# Patient Record
Sex: Female | Born: 1975 | Race: White | Hispanic: No | Marital: Single | Smoking: Never smoker
Health system: Southern US, Community
[De-identification: ages and names within clinical notes are randomized; demographics above are authoritative.]

## PROBLEM LIST (undated history)

## (undated) DIAGNOSIS — G43909 Migraine, unspecified, not intractable, without status migrainosus: Secondary | ICD-10-CM

## (undated) DIAGNOSIS — F419 Anxiety disorder, unspecified: Secondary | ICD-10-CM

## (undated) DIAGNOSIS — D649 Anemia, unspecified: Secondary | ICD-10-CM

## (undated) DIAGNOSIS — R569 Unspecified convulsions: Secondary | ICD-10-CM

## (undated) HISTORY — PX: TONSILLECTOMY: SUR1361

## (undated) HISTORY — PX: BACK SURGERY: SHX140

## (undated) HISTORY — PX: WISDOM TOOTH EXTRACTION: SHX21

---

## 1998-03-30 ENCOUNTER — Inpatient Hospital Stay (HOSPITAL_COMMUNITY): Admission: AD | Admit: 1998-03-30 | Discharge: 1998-04-01 | Payer: Self-pay | Admitting: Family Medicine

## 1999-04-15 ENCOUNTER — Ambulatory Visit (HOSPITAL_BASED_OUTPATIENT_CLINIC_OR_DEPARTMENT_OTHER): Admission: RE | Admit: 1999-04-15 | Discharge: 1999-04-15 | Payer: Self-pay | Admitting: *Deleted

## 2000-10-26 ENCOUNTER — Other Ambulatory Visit: Admission: RE | Admit: 2000-10-26 | Discharge: 2000-10-26 | Payer: Self-pay | Admitting: Obstetrics and Gynecology

## 2001-06-03 ENCOUNTER — Encounter: Payer: Self-pay | Admitting: Emergency Medicine

## 2001-06-03 ENCOUNTER — Emergency Department (HOSPITAL_COMMUNITY): Admission: EM | Admit: 2001-06-03 | Discharge: 2001-06-03 | Payer: Self-pay | Admitting: Emergency Medicine

## 2007-11-10 ENCOUNTER — Ambulatory Visit (HOSPITAL_COMMUNITY): Admission: RE | Admit: 2007-11-10 | Discharge: 2007-11-10 | Payer: Self-pay | Admitting: Orthopaedic Surgery

## 2011-03-12 LAB — CBC
HCT: 35.5 — ABNORMAL LOW
Hemoglobin: 11.9 — ABNORMAL LOW
MCHC: 33.5
MCV: 74.1 — ABNORMAL LOW
Platelets: 355
RBC: 4.79
RDW: 18.6 — ABNORMAL HIGH
WBC: 11.6 — ABNORMAL HIGH

## 2011-03-12 LAB — BASIC METABOLIC PANEL
BUN: 8
CO2: 29
Calcium: 9.1
Chloride: 106
Creatinine, Ser: 0.78
GFR calc Af Amer: >60
GFR calc non Af Amer: >60
Glucose, Bld: 101 — ABNORMAL HIGH
Potassium: 4.2
Sodium: 138

## 2013-12-06 ENCOUNTER — Other Ambulatory Visit (HOSPITAL_COMMUNITY)
Admission: RE | Admit: 2013-12-06 | Discharge: 2013-12-06 | Disposition: A | Discharge status: Home / Self Care | Payer: 59 | Source: Ambulatory Visit | Attending: Nurse Practitioner | Admitting: Nurse Practitioner

## 2013-12-06 ENCOUNTER — Other Ambulatory Visit: Payer: Self-pay | Admitting: Nurse Practitioner

## 2013-12-06 DIAGNOSIS — Z1151 Encounter for screening for human papillomavirus (HPV): Secondary | ICD-10-CM | POA: Insufficient documentation

## 2013-12-06 DIAGNOSIS — Z01419 Encounter for gynecological examination (general) (routine) without abnormal findings: Secondary | ICD-10-CM | POA: Insufficient documentation

## 2013-12-08 LAB — CYTOLOGY - PAP

## 2014-01-17 ENCOUNTER — Other Ambulatory Visit: Payer: Self-pay | Admitting: Nurse Practitioner

## 2015-06-27 ENCOUNTER — Ambulatory Visit (INDEPENDENT_AMBULATORY_CARE_PROVIDER_SITE_OTHER): Payer: 59 | Admitting: Endocrinology

## 2015-06-27 ENCOUNTER — Encounter: Payer: Self-pay | Admitting: Endocrinology

## 2015-06-27 VITALS — BP 132/86 | HR 81 | Temp 98.5°F | Ht 67.5 in | Wt 255.0 lb

## 2015-06-27 DIAGNOSIS — G43809 Other migraine, not intractable, without status migrainosus: Secondary | ICD-10-CM | POA: Diagnosis not present

## 2015-06-27 DIAGNOSIS — G40909 Epilepsy, unspecified, not intractable, without status epilepticus: Secondary | ICD-10-CM

## 2015-06-27 DIAGNOSIS — F411 Generalized anxiety disorder: Secondary | ICD-10-CM

## 2015-06-27 NOTE — Progress Notes (Signed)
Subjective:    Patient ID: Joanne Long, female    DOB: 11-May-1976, 40 y.o.   MRN: [id number]  HPI Pt states 6 weeks of slight swelling at the anterior neck, and assoc pain.  she is unaware of ever having had thyroid problems in the past.  she has no h/o XRT or surgery to the neck.  She is not at risk for another pregnancy.   No past medical history on file.  No past surgical history on file.  Social History   Social History  . Marital Status: Single    Spouse Name: N/A  . Number of Children: N/A  . Years of Education: N/A   Occupational History  . Not on file.   Social History Main Topics  . Smoking status: Never Smoker   . Smokeless tobacco: Not on file  . Alcohol Use: No  . Drug Use: Not on file  . Sexual Activity: Not on file   Other Topics Concern  . Not on file   Social History Narrative  . No narrative on file    No current outpatient prescriptions on file prior to visit.   No current facility-administered medications on file prior to visit.    No Known Allergies  Family History  Problem Relation Age of Onset  . Thyroid disease Neg Hx     BP 132/86 mmHg  Pulse 81  Temp(Src) 98.5 F (36.9 C) (Oral)  Ht 5' 7.5" (1.715 m)  Wt 255 lb (115.667 kg)  BMI 39.33 kg/m2  SpO2 95%  Review of Systems Denies hoarseness, visual loss, chest pain, sob, cough, skin rash, depression, cold intolerance, numbness, and rhinorrhea.  She has fatigue, intermittent headache, and easy bruising.  She has intermittent solid=liquid dysphagia.      Objective:   Physical Exam VS: see vs page GEN: no distress HEAD: head: no deformity eyes: no periorbital swelling, no proptosis external nose and ears are normal mouth: no lesion seen NECK: thyroid is 3-4 times normal size.  No nodule is palpable.   CHEST WALL: no deformity LUNGS:  Clear to auscultation.  CV: reg rate and rhythm, no murmur ABD: abdomen is soft, nontender.  no hepatosplenomegaly.  not distended.  no  hernia MUSCULOSKELETAL: muscle bulk and strength are grossly normal.  no obvious joint swelling.  gait is normal and steady EXTEMITIES: no deformity.  no edema NEURO:  cn 2-12 grossly intact.   readily moves all 4's.  sensation is intact to touch on all 4's.   SKIN:  Normal texture and temperature.  No rash or suspicious lesion is visible.   NODES:  None palpable at the neck.  PSYCH: alert, well-oriented.  Does not appear anxious nor depressed.   [place]: Heterogeneous enlarged thyroid suggests thyroiditis.  Small bilateral thyroid nodules  TSH=2.56  I have reviewed outside records, and summarized: Pt was noted to have goiter, and referred here.      Assessment & Plan:  Thyroiditis, probably chronic.  new to me.  We'll recheck labs soon to exclude subacute thyroiditis.    Patient is advised the following: Patient Instructions  Please repeat the thyroid blood tests in a 2-3 more weeks, at [facility].  We'll call them, to ask them to schedule for you.   Please come back for a follow-up appointment in 6 months.   If you notice any change in the size of the thyroid sooner, come back then.  most of the time, a "lumpy," imflammed thyroid will eventually become over- or  underactive.  this is usually a slow process, happening over the span of many years.

## 2015-06-27 NOTE — Patient Instructions (Addendum)
Please repeat the thyroid blood tests in a 2-3 more weeks, at [facility].  We'll call them, to ask them to schedule for you.   Please come back for a follow-up appointment in 6 months.   If you notice any change in the size of the thyroid sooner, come back then.  most of the time, a "lumpy," imflamed thyroid will eventually become over- or underactive.  this is usually a slow process, happening over the span of many years.

## 2015-06-28 DIAGNOSIS — F411 Generalized anxiety disorder: Secondary | ICD-10-CM | POA: Insufficient documentation

## 2015-06-28 DIAGNOSIS — G40909 Epilepsy, unspecified, not intractable, without status epilepticus: Secondary | ICD-10-CM | POA: Insufficient documentation

## 2015-06-28 DIAGNOSIS — G43909 Migraine, unspecified, not intractable, without status migrainosus: Secondary | ICD-10-CM | POA: Insufficient documentation

## 2015-12-28 ENCOUNTER — Ambulatory Visit: Payer: 59 | Admitting: Endocrinology

## 2016-02-15 ENCOUNTER — Other Ambulatory Visit (HOSPITAL_BASED_OUTPATIENT_CLINIC_OR_DEPARTMENT_OTHER): Payer: Self-pay | Admitting: Physician Assistant

## 2016-02-15 ENCOUNTER — Ambulatory Visit (HOSPITAL_BASED_OUTPATIENT_CLINIC_OR_DEPARTMENT_OTHER)
Admission: RE | Admit: 2016-02-15 | Discharge: 2016-02-15 | Disposition: A | Discharge status: Home / Self Care | Payer: 59 | Source: Ambulatory Visit | Attending: Physician Assistant | Admitting: Physician Assistant

## 2016-02-15 DIAGNOSIS — M7989 Other specified soft tissue disorders: Secondary | ICD-10-CM

## 2016-02-15 DIAGNOSIS — M7121 Synovial cyst of popliteal space [Baker], right knee: Secondary | ICD-10-CM | POA: Insufficient documentation

## 2017-01-30 ENCOUNTER — Encounter (HOSPITAL_COMMUNITY): Payer: Self-pay | Admitting: Emergency Medicine

## 2017-01-30 DIAGNOSIS — G40909 Epilepsy, unspecified, not intractable, without status epilepticus: Secondary | ICD-10-CM | POA: Insufficient documentation

## 2017-01-30 DIAGNOSIS — Z79899 Other long term (current) drug therapy: Secondary | ICD-10-CM | POA: Insufficient documentation

## 2017-01-30 DIAGNOSIS — Y92488 Other paved roadways as the place of occurrence of the external cause: Secondary | ICD-10-CM | POA: Diagnosis not present

## 2017-01-30 DIAGNOSIS — Y9389 Activity, other specified: Secondary | ICD-10-CM | POA: Diagnosis not present

## 2017-01-30 DIAGNOSIS — S06370A Contusion, laceration, and hemorrhage of cerebellum without loss of consciousness, initial encounter: Principal | ICD-10-CM | POA: Insufficient documentation

## 2017-01-30 DIAGNOSIS — F411 Generalized anxiety disorder: Secondary | ICD-10-CM | POA: Diagnosis not present
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
## 2017-01-31 ENCOUNTER — Emergency Department (HOSPITAL_COMMUNITY): Payer: 59

## 2017-01-31 ENCOUNTER — Encounter (HOSPITAL_COMMUNITY): Payer: Self-pay | Admitting: *Deleted

## 2017-01-31 ENCOUNTER — Inpatient Hospital Stay (HOSPITAL_COMMUNITY): Payer: 59

## 2017-01-31 ENCOUNTER — Observation Stay (HOSPITAL_COMMUNITY)
Admission: EM | Admit: 2017-01-31 | Discharge: 2017-02-01 | Disposition: A | Discharge status: Home / Self Care | Payer: 59 | Attending: Neurosurgery | Admitting: Neurosurgery

## 2017-01-31 DIAGNOSIS — I614 Nontraumatic intracerebral hemorrhage in cerebellum: Secondary | ICD-10-CM | POA: Diagnosis present

## 2017-01-31 DIAGNOSIS — S06370A Contusion, laceration, and hemorrhage of cerebellum without loss of consciousness, initial encounter: Secondary | ICD-10-CM | POA: Diagnosis not present

## 2017-01-31 HISTORY — DX: Migraine, unspecified, not intractable, without status migrainosus: G43.909

## 2017-01-31 HISTORY — DX: Unspecified convulsions: R56.9

## 2017-01-31 LAB — CBC WITH DIFFERENTIAL/PLATELET
Basophils Absolute: 0 10*3/uL (ref 0.0–0.1)
Basophils Relative: 0 %
Eosinophils Absolute: 0 10*3/uL (ref 0.0–0.7)
Eosinophils Relative: 0 %
HEMATOCRIT: 38.4 % (ref 36.0–46.0)
Hemoglobin: 13 g/dL (ref 12.0–15.0)
LYMPHS ABS: 3 10*3/uL (ref 0.7–4.0)
LYMPHS PCT: 33 %
MCH: 27.5 pg (ref 26.0–34.0)
MCHC: 33.9 g/dL (ref 30.0–36.0)
MCV: 81.2 fL (ref 78.0–100.0)
MONO ABS: 0.6 10*3/uL (ref 0.1–1.0)
MONOS PCT: 6 %
NEUTROS ABS: 5.7 10*3/uL (ref 1.7–7.7)
Neutrophils Relative %: 61 %
Platelets: 288 10*3/uL (ref 150–400)
RBC: 4.73 MIL/uL (ref 3.87–5.11)
RDW: 13.8 % (ref 11.5–15.5)
WBC: 9.2 10*3/uL (ref 4.0–10.5)

## 2017-01-31 LAB — TYPE AND SCREEN
ABO/RH(D): A POS
ABO/RH(D): A POS
Antibody Screen: NEGATIVE
Antibody Screen: NEGATIVE

## 2017-01-31 LAB — PROTIME-INR
INR: 1.01
Prothrombin Time: 13.3 seconds (ref 11.4–15.2)

## 2017-01-31 LAB — BASIC METABOLIC PANEL
ANION GAP: 7 (ref 5–15)
BUN: 14 mg/dL (ref 6–20)
CHLORIDE: 108 mmol/L (ref 101–111)
CO2: 24 mmol/L (ref 22–32)
Calcium: 8.9 mg/dL (ref 8.9–10.3)
Creatinine, Ser: 0.98 mg/dL (ref 0.44–1.00)
GFR calc Af Amer: >60 mL/min (ref >60)
GFR calc non Af Amer: >60 mL/min (ref >60)
GLUCOSE: 101 mg/dL — AB (ref 65–99)
POTASSIUM: 3.7 mmol/L (ref 3.5–5.1)
Sodium: 139 mmol/L (ref 135–145)

## 2017-01-31 LAB — ABO/RH
ABO/RH(D): A POS
ABO/RH(D): A POS

## 2017-01-31 LAB — MRSA PCR SCREENING: MRSA BY PCR: NEGATIVE

## 2017-01-31 MED ORDER — IMIPRAMINE HCL 10 MG PO TABS
20.0000 mg | ORAL_TABLET | Freq: Every day | ORAL | Status: DC
  Administered 2017-01-31: 20 mg via ORAL
  Filled 2017-01-31: qty 2

## 2017-01-31 MED ORDER — SODIUM CHLORIDE 0.9% FLUSH: 3.0000 mL | Freq: Two times a day (BID) | INTRAVENOUS | Status: DC

## 2017-01-31 MED ORDER — HYDROMORPHONE HCL 1 MG/ML IJ SOLN
0.5000 mg | INTRAMUSCULAR | Status: DC | PRN
  Administered 2017-01-31 – 2017-02-01 (×6): 0.5 mg via INTRAVENOUS
  Filled 2017-01-31 (×6): qty 0.5

## 2017-01-31 MED ORDER — LACTATED RINGERS IV SOLN
INTRAVENOUS | Status: AC
  Administered 2017-01-31: 07:00:00 via INTRAVENOUS

## 2017-01-31 MED ORDER — SODIUM CHLORIDE 0.9 % IV SOLN
INTRAVENOUS | Status: DC
  Administered 2017-01-31 – 2017-02-01 (×2): via INTRAVENOUS

## 2017-01-31 MED ORDER — HYDROCODONE-ACETAMINOPHEN 5-325 MG PO TABS
1.0000 | ORAL_TABLET | ORAL | Status: DC | PRN
  Administered 2017-01-31 – 2017-02-01 (×2): 2 via ORAL
  Filled 2017-01-31 (×2): qty 2

## 2017-01-31 MED ORDER — LAMOTRIGINE 25 MG PO TABS
150.0000 mg | ORAL_TABLET | Freq: Every day | ORAL | Status: DC
  Administered 2017-02-01: 09:00:00 150 mg via ORAL
  Filled 2017-01-31: qty 2

## 2017-01-31 MED ORDER — LAMOTRIGINE 100 MG PO TABS
200.0000 mg | ORAL_TABLET | Freq: Every day | ORAL | Status: DC
  Administered 2017-01-31: 200 mg via ORAL
  Filled 2017-01-31: qty 2

## 2017-01-31 MED ORDER — LAMOTRIGINE 100 MG PO TABS: 150.0000 mg | ORAL_TABLET | Freq: Two times a day (BID) | ORAL | Status: DC

## 2017-01-31 MED ORDER — GADOBENATE DIMEGLUMINE 529 MG/ML IV SOLN
20.0000 mL | Freq: Once | INTRAVENOUS | Status: AC
  Administered 2017-01-31: 20 mL via INTRAVENOUS

## 2017-01-31 MED ORDER — ACETAMINOPHEN 325 MG PO TABS
650.0000 mg | ORAL_TABLET | Freq: Four times a day (QID) | ORAL | Status: DC | PRN
Start: 2017-01-31 — End: 2017-02-01

## 2017-01-31 MED ORDER — ONDANSETRON HCL 4 MG/2ML IJ SOLN: 4.0000 mg | Freq: Four times a day (QID) | INTRAMUSCULAR | Status: DC | PRN

## 2017-01-31 MED ORDER — ACETAMINOPHEN 650 MG RE SUPP: 650.0000 mg | Freq: Four times a day (QID) | RECTAL | Status: DC | PRN

## 2017-01-31 MED ORDER — TOPIRAMATE 100 MG PO TABS: 100.0000 mg | ORAL_TABLET | Freq: Every day | ORAL | Status: DC

## 2017-01-31 MED ORDER — ONDANSETRON HCL 4 MG PO TABS: 4.0000 mg | ORAL_TABLET | Freq: Four times a day (QID) | ORAL | Status: DC | PRN

## 2017-01-31 MED ORDER — SUMATRIPTAN SUCCINATE 100 MG PO TABS
100.0000 mg | ORAL_TABLET | ORAL | Status: DC | PRN
  Filled 2017-01-31: qty 1

## 2017-01-31 MED ORDER — DEXAMETHASONE SODIUM PHOSPHATE 10 MG/ML IJ SOLN
10.0000 mg | Freq: Once | INTRAMUSCULAR | Status: AC
  Administered 2017-01-31: 10 mg via INTRAVENOUS
  Filled 2017-01-31: qty 1

## 2017-01-31 MED ORDER — METOCLOPRAMIDE HCL 5 MG/ML IJ SOLN
10.0000 mg | INTRAMUSCULAR | Status: AC
  Administered 2017-01-31: 10 mg via INTRAVENOUS
  Filled 2017-01-31: qty 2
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
## 2017-01-31 NOTE — ED Notes (Signed)
Patient transported to CT 

## 2017-01-31 NOTE — Progress Notes (Signed)
Orthopedic Tech Progress Note Patient Details:  Joanne Long Nov 05, 1975 [id number]  Ortho Devices Type of Ortho Device: Velcro wrist splint Ortho Device/Splint Location: rue Ortho Device/Splint Interventions: Application   Rollyn Scialdone 01/31/2017, 7:12 AM
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
## 2017-02-01 DIAGNOSIS — S06370A Contusion, laceration, and hemorrhage of cerebellum without loss of consciousness, initial encounter: Secondary | ICD-10-CM | POA: Diagnosis not present

## 2017-02-01 LAB — HIV ANTIBODY (ROUTINE TESTING W REFLEX): HIV Screen 4th Generation wRfx: NONREACTIVE

## 2017-02-01 MED ORDER — OXYCODONE-ACETAMINOPHEN 5-325 MG PO TABS: 1.0000 | ORAL_TABLET | ORAL | 0 refills | Status: DC | PRN

## 2017-02-01 NOTE — Discharge Instructions (Signed)
Intracerebral Hemorrhage  An intracerebral hemorrhage occurs when a blood vessel in the brain leaks or bursts. Areas of the brain that should receive blood, oxygen, and nutrients from the damaged blood vessel are deprived of blood flow. This causes areas of the brain to become damaged. Damage also occurs to areas of the brain where the leaked blood accumulates and presses on normal tissue. This is a medical emergency. This can cause permanent damage and loss of brain function. Early and aggressive treatment leads to better recovery.  What are the causes?  · Head injury (trauma).  · A ballooning of a weak section in a blood vessel (aneurysm).  · Hardened, thin blood vessels. Blood vessel walls lined with plaque become thin and hardened. These hardened, thin artery walls can crack open and allow blood to flow out of the blood vessel.  · An abnormal formation of a blood vessel (arteriovenous malformation). This condition results in an abnormal tangle of thin-walled blood vessels.  · Protein buildup on the artery walls of the brain (amyloid angiopathy).  Sometimes the cause of an intracerebral hemorrhage is not known.  What increases the risk?  · High blood pressure (hypertension).  · Female gender.  · Advancing age.  · Moderate or heavy use of alcohol.  · Taking blood-thinning medicines (anticoagulants).  What are the signs or symptoms?  · Sudden, severe headache with no known cause. The headache is often described as the worst headache ever experienced.  · Sudden confusion.  · Sudden trouble speaking (aphasia) or understanding.  · Seizures.  · Sudden nausea and vomiting.  · Hypertension.  · Sudden weakness or numbness of the face, arm, or leg, especially on one side of the body.  · Sudden trouble seeing in one or both eyes.  · Sudden trouble walking or difficulty moving arms or legs.  · Sudden dizziness.  · Sudden loss of balance or coordination.  How is this diagnosed?  An intracerebral hemorrhage is diagnosed  with:  · A history and physical exam.  · Imaging tests to view the brain. These may include a CT scan or MRI.  · Imaging tests to view vessels in the brain. These may include computed tomography angiography (CTA) or a magnetic resonance angiogram (MRA).    How is this treated?  An intracerebral hemorrhage requires emergency treatment. The goals of treatment are to stop the bleeding, control pressure in the brain, and relieve symptoms. Treatment may include:  · Medicines to manage:  ? Blood pressure.  ? Pain.  ? Nausea or vomiting.  ? Seizures.  ? Fever.  · Other medicines, blood products, or vitamin K may also be given to control the bleeding.  · A tube (shunt) in the brain to relieve pressure.  · Assisted breathing (ventilation).  · Surgery to stop bleeding, remove a blood clot or tumor, and reduce pressure. Surgeries to do this include:  ? Craniotomy. This is a surgery to remove part of the skull to reduce pressure.  ? Stereotactic aspiration. During this procedure, a needle or syringe is inserted into the brain to remove blood.    Follow these instructions at home:  · Take medicines only as directed by your health care provider.  · Eat healthy foods as directed by your health care provider:  ? A diet low in salt (sodium), saturated fat, trans fat, and cholesterol may be recommended to manage your blood pressure.  ? Foods may need to be soft or pureed, or small bites may need   to be taken in order to avoid aspirating or choking.  ? If studies show that your ability to swallow safely has been affected, you may need to seek help from specialists such as a dietitian, speech and language pathologist, or an occupational therapist. These health care providers can teach you how to safely get the nutrition your body needs.  · Follow physical activity guidelines as directed by your health care team.  · Avoid heavy alcohol use.  · Manage any other health care conditions you may have, if applicable.  · A safe home environment  is important to reduce the risk of falls. Your health care provider may arrange for specialists to evaluate your home. Having grab bars in the bedroom and bathroom is often important. Your health care provider may arrange for special equipment to be used at home, such as raised toilets and a seat for the shower.  · Do physical, occupational, and speech therapy as directed by your health care provider. Ongoing therapy may be needed to maximize your recovery.  · Use a walker or a cane at all times if directed by your health care provider  · Keep all follow-up visits as directed by your health care provider. This is important. This includes any referrals, physical therapy, rehabilitation, and laboratory tests.  Get help right away if:  · You have a sudden, severe headache with no known cause.  · You have sudden:  ? Confusion.  ? Trouble speaking (aphasia) or understanding speech.  ? Weakness or numbness of the face, arm, or leg, especially on one side of the body.  ? Trouble seeing in one or both eyes.  ? Trouble walking or difficulty moving arms or legs.  ? Dizziness.  · You have a sudden loss of balance or coordination.  · You have a seizure.  · You have a partial or total loss of consciousness.  These symptoms may represent a serious problem that is an emergency. Do not wait to see if the symptoms will go away. Get medical help right away. Call your local emergency services (911 in the [place] Do not drive yourself to the hospital.  This information is not intended to replace advice given to you by your health care provider. Make sure you discuss any questions you have with your health care provider.  Document Released: 12/28/2013 Document Revised: 11/08/2015 Document Reviewed: 08/19/2013  Elsevier Interactive Patient Education © 2018 [employer]
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
## 2017-02-01 NOTE — Progress Notes (Signed)
Pt d/c to home by car with family. Assessment stable. Prescription given. All questions answered 

## 2017-02-20 ENCOUNTER — Other Ambulatory Visit: Payer: Self-pay | Admitting: Neurosurgery

## 2017-02-20 DIAGNOSIS — I614 Nontraumatic intracerebral hemorrhage in cerebellum: Secondary | ICD-10-CM

## 2017-03-05 ENCOUNTER — Other Ambulatory Visit: Payer: Self-pay | Admitting: Student

## 2017-03-06 ENCOUNTER — Ambulatory Visit (HOSPITAL_COMMUNITY)
Admission: RE | Admit: 2017-03-06 | Discharge: 2017-03-06 | Disposition: A | Discharge status: Home / Self Care | Payer: 59 | Source: Ambulatory Visit | Attending: Neurosurgery | Admitting: Neurosurgery

## 2017-03-06 ENCOUNTER — Other Ambulatory Visit: Payer: Self-pay | Admitting: Neurosurgery

## 2017-03-06 DIAGNOSIS — I614 Nontraumatic intracerebral hemorrhage in cerebellum: Secondary | ICD-10-CM

## 2017-03-06 DIAGNOSIS — Q282 Arteriovenous malformation of cerebral vessels: Secondary | ICD-10-CM | POA: Insufficient documentation

## 2017-03-06 DIAGNOSIS — G43909 Migraine, unspecified, not intractable, without status migrainosus: Secondary | ICD-10-CM | POA: Diagnosis not present

## 2017-03-06 DIAGNOSIS — R569 Unspecified convulsions: Secondary | ICD-10-CM | POA: Diagnosis not present

## 2017-03-06 HISTORY — PX: IR ANGIO VERTEBRAL SEL VERTEBRAL BILAT MOD SED: IMG5369

## 2017-03-06 HISTORY — PX: IR ANGIO EXTERNAL CAROTID SEL EXT CAROTID BILAT MOD SED: IMG5372

## 2017-03-06 HISTORY — PX: IR ANGIO INTRA EXTRACRAN SEL INTERNAL CAROTID BILAT MOD SED: IMG5363

## 2017-03-06 LAB — URINALYSIS, ROUTINE W REFLEX MICROSCOPIC
Bilirubin Urine: NEGATIVE
Glucose, UA: NEGATIVE mg/dL
KETONES UR: NEGATIVE mg/dL
Nitrite: NEGATIVE
PH: 5 (ref 5.0–8.0)
PROTEIN: NEGATIVE mg/dL
Specific Gravity, Urine: 1.028 (ref 1.005–1.030)

## 2017-03-06 LAB — CBC WITH DIFFERENTIAL/PLATELET
BASOS PCT: 0 %
Basophils Absolute: 0 10*3/uL (ref 0.0–0.1)
EOS ABS: 0 10*3/uL (ref 0.0–0.7)
EOS PCT: 0 %
HEMATOCRIT: 41.6 % (ref 36.0–46.0)
Hemoglobin: 13.5 g/dL (ref 12.0–15.0)
Lymphocytes Relative: 29 %
Lymphs Abs: 2.4 10*3/uL (ref 0.7–4.0)
MCH: 27.2 pg (ref 26.0–34.0)
MCHC: 32.5 g/dL (ref 30.0–36.0)
MCV: 83.9 fL (ref 78.0–100.0)
MONO ABS: 0.5 10*3/uL (ref 0.1–1.0)
MONOS PCT: 6 %
NEUTROS ABS: 5.5 10*3/uL (ref 1.7–7.7)
Neutrophils Relative %: 65 %
PLATELETS: 298 10*3/uL (ref 150–400)
RBC: 4.96 MIL/uL (ref 3.87–5.11)
RDW: 13.9 % (ref 11.5–15.5)
WBC: 8.4 10*3/uL (ref 4.0–10.5)

## 2017-03-06 LAB — BASIC METABOLIC PANEL
Anion gap: 7 (ref 5–15)
BUN: 10 mg/dL (ref 6–20)
CALCIUM: 8.8 mg/dL — AB (ref 8.9–10.3)
CO2: 22 mmol/L (ref 22–32)
CREATININE: 0.85 mg/dL (ref 0.44–1.00)
Chloride: 111 mmol/L (ref 101–111)
GFR calc non Af Amer: >60 mL/min (ref >60)
Glucose, Bld: 100 mg/dL — ABNORMAL HIGH (ref 65–99)
Potassium: 3.7 mmol/L (ref 3.5–5.1)
SODIUM: 140 mmol/L (ref 135–145)

## 2017-03-06 LAB — APTT: aPTT: 32 seconds (ref 24–36)

## 2017-03-06 LAB — PREGNANCY, URINE: Preg Test, Ur: NEGATIVE

## 2017-03-06 LAB — PROTIME-INR
INR: 0.97
Prothrombin Time: 12.8 seconds (ref 11.4–15.2)

## 2017-03-06 MED ORDER — LIDOCAINE HCL (PF) 1 % IJ SOLN
INTRAMUSCULAR | Status: AC | PRN
  Administered 2017-03-06: 5 mL

## 2017-03-06 MED ORDER — CHLORHEXIDINE GLUCONATE CLOTH 2 % EX PADS: 6.0000 | MEDICATED_PAD | Freq: Once | CUTANEOUS | Status: DC

## 2017-03-06 MED ORDER — SODIUM CHLORIDE 0.9 % IV SOLN
INTRAVENOUS | Status: DC
  Administered 2017-03-06: 09:00:00 via INTRAVENOUS

## 2017-03-06 MED ORDER — FENTANYL CITRATE (PF) 100 MCG/2ML IJ SOLN
INTRAMUSCULAR | Status: AC | PRN
  Administered 2017-03-06: 25 ug via INTRAVENOUS

## 2017-03-06 MED ORDER — HEPARIN SODIUM (PORCINE) 1000 UNIT/ML IJ SOLN
INTRAMUSCULAR | Status: AC | PRN
  Administered 2017-03-06: 2000 [IU] via INTRAVENOUS

## 2017-03-06 MED ORDER — SODIUM CHLORIDE 0.9 % IV SOLN
INTRAVENOUS | Status: DC
Start: 2017-03-06 — End: 2017-03-07

## 2017-03-06 MED ORDER — HEPARIN SODIUM (PORCINE) 1000 UNIT/ML IJ SOLN
INTRAMUSCULAR | Status: AC
  Filled 2017-03-06: qty 1

## 2017-03-06 MED ORDER — MIDAZOLAM HCL 2 MG/2ML IJ SOLN
INTRAMUSCULAR | Status: AC
  Filled 2017-03-06: qty 2

## 2017-03-06 MED ORDER — CEFAZOLIN SODIUM-DEXTROSE 2-4 GM/100ML-% IV SOLN: 2.0000 g | INTRAVENOUS | Status: DC

## 2017-03-06 MED ORDER — LIDOCAINE HCL (PF) 1 % IJ SOLN
INTRAMUSCULAR | Status: AC
  Filled 2017-03-06: qty 30

## 2017-03-06 MED ORDER — MIDAZOLAM HCL 2 MG/2ML IJ SOLN
INTRAMUSCULAR | Status: AC | PRN
  Administered 2017-03-06: 1 mg via INTRAVENOUS

## 2017-03-06 MED ORDER — HYDROCODONE-ACETAMINOPHEN 5-325 MG PO TABS: 1.0000 | ORAL_TABLET | ORAL | Status: DC | PRN

## 2017-03-06 MED ORDER — FENTANYL CITRATE (PF) 100 MCG/2ML IJ SOLN
INTRAMUSCULAR | Status: AC
  Filled 2017-03-06: qty 2

## 2017-03-06 MED ORDER — IOPAMIDOL (ISOVUE-300) INJECTION 61%
INTRAVENOUS | Status: AC
  Administered 2017-03-06: 72 mL
  Filled 2017-03-06: qty 150
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
## 2017-03-06 NOTE — Sedation Documentation (Signed)
Patient is resting comfortably. 

## 2017-03-06 NOTE — Discharge Instructions (Signed)

## 2017-03-06 NOTE — Sedation Documentation (Signed)
Patient denies pain and is resting comfortably.  

## 2017-03-09 ENCOUNTER — Encounter: Payer: Self-pay | Admitting: Neurology

## 2017-03-09 ENCOUNTER — Ambulatory Visit (INDEPENDENT_AMBULATORY_CARE_PROVIDER_SITE_OTHER): Payer: 59 | Admitting: Neurology

## 2017-03-09 VITALS — BP 145/81 | HR 88 | Ht 68.0 in | Wt 281.5 lb

## 2017-03-09 DIAGNOSIS — G43809 Other migraine, not intractable, without status migrainosus: Secondary | ICD-10-CM | POA: Diagnosis not present

## 2017-03-09 DIAGNOSIS — G40909 Epilepsy, unspecified, not intractable, without status epilepticus: Secondary | ICD-10-CM

## 2017-03-09 DIAGNOSIS — I614 Nontraumatic intracerebral hemorrhage in cerebellum: Secondary | ICD-10-CM

## 2017-03-09 MED ORDER — OXYCODONE-ACETAMINOPHEN 5-325 MG PO TABS: 1.0000 | ORAL_TABLET | ORAL | 0 refills | Status: DC | PRN

## 2017-03-09 MED ORDER — TOPIRAMATE 50 MG PO TABS: ORAL_TABLET | ORAL | 5 refills | Status: DC

## 2017-03-09 MED ORDER — LAMOTRIGINE 100 MG PO TABS: ORAL_TABLET | ORAL | 5 refills | Status: DC
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
## 2017-03-09 NOTE — Patient Instructions (Signed)
   We will increase the Topamax to 50 mg in the morning and  in the evening.  Topamax (topiramate) is a seizure medication that has an FDA approval for seizures and for migraine headache. Potential side effects of this medication include weight loss, cognitive slowing, tingling in the fingers and toes, and carbonated drinks will taste bad. If any significant side effects are noted on this drug, please contact our office.

## 2017-03-10 ENCOUNTER — Telehealth: Payer: Self-pay | Admitting: *Deleted

## 2017-03-10 ENCOUNTER — Encounter (HOSPITAL_COMMUNITY): Payer: Self-pay | Admitting: Neurosurgery

## 2017-03-10 NOTE — Telephone Encounter (Signed)
Gave completed/signed FMLA forms back to medical records to process for patient. 

## 2017-03-12 NOTE — Telephone Encounter (Signed)
Lm on VM advised patient FMLA paperwork has been completed and a $50 processing fee still needs to be paid.  Advised patient to contact our office.

## 2017-03-13 ENCOUNTER — Other Ambulatory Visit (HOSPITAL_COMMUNITY): Payer: Self-pay | Admitting: Interventional Radiology

## 2017-03-13 ENCOUNTER — Other Ambulatory Visit (HOSPITAL_COMMUNITY): Payer: Self-pay | Admitting: Neurosurgery

## 2017-03-13 DIAGNOSIS — Q282 Arteriovenous malformation of cerebral vessels: Secondary | ICD-10-CM

## 2017-03-16 ENCOUNTER — Telehealth: Payer: Self-pay | Admitting: Neurology

## 2017-03-16 ENCOUNTER — Other Ambulatory Visit: Payer: Self-pay | Admitting: Neurosurgery

## 2017-03-16 NOTE — Telephone Encounter (Signed)
FMLA paperwork was filled out by Dr. Anne Hahn. Pt came to p/u 10/1 but said it wasn't correct. She said she spoke with Kara Mead about the 32 hours she got from her previous Dr.,who has retired, & stated that she still needed that many hours if possible but Pt stated that Dr. Anne Hahn only gave her 8 hours. She has temp FMLA through her PCP that will expire soon. I put the FMLA paperwork that needs to be updated on Arleny Kruger's desk.

## 2017-03-16 NOTE — Telephone Encounter (Signed)
Not sure whether changes FMLA form, I think that 32 hours a month is a bit excessive. She may require a leave of absence if she requires surgery for the AVM.

## 2017-03-17 DIAGNOSIS — Z0289 Encounter for other administrative examinations: Secondary | ICD-10-CM

## 2017-03-17 NOTE — Telephone Encounter (Signed)
Patient called office in reference to the hours of FMLA paperwork.  Please call back.

## 2017-03-18 NOTE — Telephone Encounter (Signed)
Pt has called back to say that she would like for the change to just state 16 hours and not 2 episodes  8 hours.  Just have it to state 16 hours. Pt not asking for a call back.  Pt just stated to please initial to show as valid. Pt only asking to be called when ready for her to pick up

## 2017-03-18 NOTE — Telephone Encounter (Signed)
I called the patient. I left a message. The patient once to have 32 hours a month on her FMLA, I currently have put 8 hours, but I will go up to 16 hours. I do not feel comfortable doing her 4 days off from work each month. I think this is a bit excessive.

## 2017-03-24 NOTE — Telephone Encounter (Signed)
Pt is calling back to see if the paperwork has been adjusted by Dr Anne Hahn, please call

## 2017-03-24 NOTE — Telephone Encounter (Signed)
Called patient. Advised Dr Anne Hahn agrees to give her 16hr total. Will addend paperwork. She states mother will pick up. (She is on DPR). She already paid for paperwork. Placed up front for pt/mother.

## 2017-03-24 NOTE — Telephone Encounter (Signed)
Pt has asked that I make the request that she get a call back as soon as possible because she has been waiting a while now

## 2017-04-01 ENCOUNTER — Inpatient Hospital Stay (HOSPITAL_COMMUNITY)
Admission: RE | Admit: 2017-04-01 | Discharge: 2017-04-01 | Disposition: A | Discharge status: Home / Self Care | Payer: 59 | Source: Ambulatory Visit

## 2017-04-01 NOTE — Pre-Procedure Instructions (Addendum)
Joanne Long  04/01/2017      [id number] - [place], [place] - [address] [place] [address] Phone: [phone] Fax: [phone]    Your procedure is scheduled on Tuesday, April 28, 2017  [facility] "A" at 9:45 A.M.  Call this number if you have problems the morning of surgery:  [phone]   Remember:  Do not eat food or drink liquids after midnight.  Take these medicines the morning of surgery with A SIP OF WATER: LamoTRIgine (LAMICTAL) and Topiramate (TOPAMAX). If needed OxyCODONE-acetaminophen (ROXICET) for pain.  7 days before surgery (Nov.6), stop taking all Aspirins, Vitamins, Fish oils, and Herbal medications. Also stop all NSAIDS i.e. Advil, Ibuprofen, Motrin, Aleve, Anaprox, Naproxen, BC and Goody Powders.   Do not wear jewelry, make-up or nail polish.  Do not wear lotions, powders, or perfumes, or deodorant.  Do not shave 48 hours prior to surgery.    Do not bring valuables to the hospital.  [facility] is not responsible for any belongings or valuables.  Contacts, dentures or bridgework may not be worn into surgery.  Leave your suitcase in the car.  After surgery it may be brought to your room.  For patients admitted to the hospital, discharge time will be determined by your treatment team.  Patients discharged the day of surgery will not be allowed to drive home.   Special instructions:   [place]- Preparing For Surgery  Before surgery, you can play an important role. Because skin is not sterile, your skin needs to be as free of germs as possible. You can reduce the number of germs on your skin by washing with CHG (chlorahexidine gluconate) Soap before surgery.  CHG is an antiseptic cleaner which kills germs and bonds with the skin to continue killing germs even after washing.  Please do not use if you have an allergy to CHG or  antibacterial soaps. If your skin becomes reddened/irritated stop using the CHG.  Do not shave (including legs and underarms) for at least 48 hours prior to first CHG shower. It is OK to shave your face.  Please follow these instructions carefully.   1. Shower the NIGHT BEFORE SURGERY and the MORNING OF SURGERY with CHG.   2. If you chose to wash your hair, wash your hair first as usual with your normal shampoo.  3. After you shampoo, rinse your hair and body thoroughly to remove the shampoo.  4. Use CHG as you would any other liquid soap. You can apply CHG directly to the skin and wash gently with a scrungie or a clean washcloth.   5. Apply the CHG Soap to your body ONLY FROM THE NECK DOWN.  Do not use on open wounds or open sores. Avoid contact with your eyes, ears, mouth and genitals (private parts). Wash Face and genitals (private parts)  with your normal soap.  6. Wash thoroughly, paying special attention to the area where your surgery will be performed.  7. Thoroughly rinse your body with warm water from the neck down.  8. DO NOT shower/wash with your normal soap after using and rinsing off the CHG Soap.  9. Pat yourself dry with a CLEAN TOWEL.  10. Wear CLEAN PAJAMAS to bed the night before surgery, wear comfortable clothes the morning of surgery  11. Place CLEAN SHEETS on your bed the night of your first shower and DO NOT  SLEEP WITH PETS.  Day of Surgery: Do not apply any deodorants/lotions. Please wear clean clothes to the hospital/surgery center.    Please read over the following fact sheets that you were given. Pain Booklet, Coughing and Deep Breathing and Surgical Site Infection Prevention

## 2017-04-10 ENCOUNTER — Ambulatory Visit (HOSPITAL_COMMUNITY)
Admission: RE | Admit: 2017-04-10 | Discharge: 2017-04-10 | Disposition: A | Discharge status: Home / Self Care | Payer: 59 | Source: Ambulatory Visit | Attending: Neurosurgery | Admitting: Neurosurgery

## 2017-04-21 ENCOUNTER — Encounter (HOSPITAL_COMMUNITY)
Admission: RE | Admit: 2017-04-21 | Discharge: 2017-04-21 | Disposition: A | Discharge status: Home / Self Care | Payer: 59 | Source: Ambulatory Visit | Attending: Neurosurgery | Admitting: Neurosurgery

## 2017-04-21 ENCOUNTER — Encounter (HOSPITAL_COMMUNITY): Payer: Self-pay

## 2017-04-21 DIAGNOSIS — Z01812 Encounter for preprocedural laboratory examination: Secondary | ICD-10-CM | POA: Insufficient documentation

## 2017-04-21 DIAGNOSIS — Q282 Arteriovenous malformation of cerebral vessels: Secondary | ICD-10-CM | POA: Diagnosis not present

## 2017-04-21 HISTORY — DX: Anemia, unspecified: D64.9

## 2017-04-21 HISTORY — DX: Anxiety disorder, unspecified: F41.9

## 2017-04-21 LAB — CBC WITH DIFFERENTIAL/PLATELET
Basophils Absolute: 0 10*3/uL (ref 0.0–0.1)
Basophils Relative: 0 %
EOS ABS: 0 10*3/uL (ref 0.0–0.7)
EOS PCT: 0 %
HCT: 42.5 % (ref 36.0–46.0)
Hemoglobin: 14.1 g/dL (ref 12.0–15.0)
LYMPHS ABS: 2.6 10*3/uL (ref 0.7–4.0)
LYMPHS PCT: 25 %
MCH: 27.6 pg (ref 26.0–34.0)
MCHC: 33.2 g/dL (ref 30.0–36.0)
MCV: 83.2 fL (ref 78.0–100.0)
MONO ABS: 0.5 10*3/uL (ref 0.1–1.0)
MONOS PCT: 5 %
Neutro Abs: 7.1 10*3/uL (ref 1.7–7.7)
Neutrophils Relative %: 70 %
PLATELETS: 301 10*3/uL (ref 150–400)
RBC: 5.11 MIL/uL (ref 3.87–5.11)
RDW: 13.4 % (ref 11.5–15.5)
WBC: 10.3 10*3/uL (ref 4.0–10.5)

## 2017-04-21 LAB — COMPREHENSIVE METABOLIC PANEL
ALT: 22 U/L (ref 14–54)
ANION GAP: 6 (ref 5–15)
AST: 20 U/L (ref 15–41)
Albumin: 4.1 g/dL (ref 3.5–5.0)
Alkaline Phosphatase: 64 U/L (ref 38–126)
BUN: 12 mg/dL (ref 6–20)
CALCIUM: 9.5 mg/dL (ref 8.9–10.3)
CHLORIDE: 104 mmol/L (ref 101–111)
CO2: 27 mmol/L (ref 22–32)
CREATININE: 0.97 mg/dL (ref 0.44–1.00)
Glucose, Bld: 112 mg/dL — ABNORMAL HIGH (ref 65–99)
Potassium: 4.3 mmol/L (ref 3.5–5.1)
Sodium: 137 mmol/L (ref 135–145)
Total Bilirubin: 0.6 mg/dL (ref 0.3–1.2)
Total Protein: 7.7 g/dL (ref 6.5–8.1)

## 2017-04-21 LAB — PROTIME-INR
INR: 0.98
PROTHROMBIN TIME: 12.9 s (ref 11.4–15.2)

## 2017-04-21 LAB — HCG, SERUM, QUALITATIVE: PREG SERUM: NEGATIVE

## 2017-04-21 LAB — APTT: aPTT: 31 seconds (ref 24–36)

## 2017-04-21 NOTE — Pre-Procedure Instructions (Signed)
Davonna BellingKerri P Rathert  04/21/2017      [id number] - [place],  - [address] [facility] [address] [place] [address] Phone: [phone] Fax: [phone]    Your procedure is scheduled on 04-28-2017  Tuesday   [facility].M[facility]   Call this number if you have problems the morning of surgery:  [phone]   Remember:  Do not eat food or drink liquids after midnight.   Take these medicines the morning of surgery with A SIP OF WATER lamotrigine(Lamictal),oyycodone if needed for severe pain,Rizatriptan(Maxalt),topiramate(Topamax)    STOP ASPIRIN,ANTIINFLAMATORIES (IBUPROFEN,ALEVE,MOTRIN,ADVIL,GOODY'S POWDERS),HERBAL SUPPLEMENTS,FISH OIL,AND VITAMINS 5-7 DAYS PRIOR TO SURGERY   Do not wear jewelry, make-up or nail polish.  Do not wear lotions, powders, or perfumes, or deoderant.  Do not shave 48 hours prior to surgery.  Men may shave face and neck.  Do not bring valuables to the hospital.  [facility] is not responsible for any belongings or valuables.  Contacts, dentures or bridgework may not be worn into surgery.  Leave your suitcase in the car.  After surgery it may be brought to your room.  For patients admitted to the hospital, discharge time will be determined by your treatment team.  Patients discharged the day of surgery will not be allowed to drive home.    Special Instructions: [place] - Preparing for Surgery  Before surgery, you can play an important role.  Because skin is not sterile, your skin needs to be as free of germs as possible.  You can reduce the number of germs on you skin by washing with CHG (chlorahexidine gluconate) soap before surgery.  CHG is an antiseptic cleaner which kills germs and bonds with the skin to continue killing germs even after washing.  Please DO NOT use if you have an allergy to CHG or antibacterial soaps.  If your skin becomes  reddened/irritated stop using the CHG and inform your nurse when you arrive at Short Stay.  Do not shave (including legs and underarms) for at least 48 hours prior to the first CHG shower.  You may shave your face.  Please follow these instructions carefully:   1.  Shower with CHG Soap the night before surgery and the   morning of Surgery.  2.  If you choose to wash your hair, wash your hair first as usual with your normal shampoo.  3.  After you shampoo, rinse your hair and body thoroughly to remove the  Shampoo.  4.  Use CHG as you would any other liquid soap.  You can apply chg directly  to the skin and wash gently with scrungie or a clean washcloth.  5.  Apply the CHG Soap to your body ONLY FROM THE NECK DOWN.   Do not use on open wounds or open sores.  Avoid contact with your eyes,  ears, mouth and genitals (private parts).  Wash genitals (private parts) with your normal soap.  6.  Wash thoroughly, paying special attention to the area where your surgery will be performed.  7.  Thoroughly rinse your body with warm water from the neck down.  8.  DO NOT shower/wash with your normal soap after using and rinsing o  the CHG Soap.  9.  Pat yourself dry with a clean towel.            10.  Wear clean pajamas.  11.  Place clean sheets on your bed the night of your first shower and do not sleep with pets.  Day of Surgery  Do not apply any lotions/deodorants the morning of surgery.  Please wear clean clothes to the hospital/surgery center.   Please read over the following fact sheets that you were given. Surgical Site Infection Prevention

## 2017-04-27 ENCOUNTER — Other Ambulatory Visit: Payer: Self-pay | Admitting: Student

## 2017-04-28 ENCOUNTER — Encounter (HOSPITAL_COMMUNITY)
Admission: RE | Disposition: A | Discharge status: Home / Self Care | Payer: Self-pay | Source: Ambulatory Visit | Attending: Neurosurgery

## 2017-04-28 ENCOUNTER — Inpatient Hospital Stay (HOSPITAL_COMMUNITY)
Admission: RE | Admit: 2017-04-28 | Discharge: 2017-04-29 | DRG: 026 | Disposition: A | Discharge status: Home / Self Care | Payer: 59 | Source: Ambulatory Visit | Attending: Neurosurgery | Admitting: Neurosurgery

## 2017-04-28 ENCOUNTER — Ambulatory Visit (HOSPITAL_COMMUNITY)
Admission: RE | Admit: 2017-04-28 | Discharge: 2017-04-28 | Disposition: A | Discharge status: Home / Self Care | Payer: 59 | Source: Ambulatory Visit | Attending: Neurosurgery | Admitting: Neurosurgery

## 2017-04-28 ENCOUNTER — Inpatient Hospital Stay (HOSPITAL_COMMUNITY): Payer: 59 | Admitting: Certified Registered Nurse Anesthetist

## 2017-04-28 ENCOUNTER — Other Ambulatory Visit: Payer: Self-pay

## 2017-04-28 ENCOUNTER — Encounter (HOSPITAL_COMMUNITY): Payer: Self-pay | Admitting: Certified Registered Nurse Anesthetist

## 2017-04-28 DIAGNOSIS — Z6841 Body Mass Index (BMI) 40.0 and over, adult: Secondary | ICD-10-CM

## 2017-04-28 DIAGNOSIS — Z79899 Other long term (current) drug therapy: Secondary | ICD-10-CM

## 2017-04-28 DIAGNOSIS — Q282 Arteriovenous malformation of cerebral vessels: Secondary | ICD-10-CM

## 2017-04-28 DIAGNOSIS — Z975 Presence of (intrauterine) contraceptive device: Secondary | ICD-10-CM | POA: Diagnosis not present

## 2017-04-28 DIAGNOSIS — F419 Anxiety disorder, unspecified: Secondary | ICD-10-CM | POA: Diagnosis present

## 2017-04-28 HISTORY — PX: RADIOLOGY WITH ANESTHESIA: SHX6223

## 2017-04-28 HISTORY — PX: IR ANGIO VERTEBRAL SEL VERTEBRAL UNI L MOD SED: IMG5367

## 2017-04-28 HISTORY — PX: IR TRANSCATH/EMBOLIZ: IMG695

## 2017-04-28 HISTORY — PX: IR NEURO EACH ADD'L AFTER BASIC UNI LEFT (MS): IMG5373

## 2017-04-28 LAB — CREATININE, SERUM
CREATININE: 0.86 mg/dL (ref 0.44–1.00)
GFR calc Af Amer: >60 mL/min (ref >60)
GFR calc non Af Amer: >60 mL/min (ref >60)

## 2017-04-28 LAB — CBC
HEMATOCRIT: 37.8 % (ref 36.0–46.0)
HEMOGLOBIN: 12.3 g/dL (ref 12.0–15.0)
MCH: 27.5 pg (ref 26.0–34.0)
MCHC: 32.5 g/dL (ref 30.0–36.0)
MCV: 84.4 fL (ref 78.0–100.0)
Platelets: 255 10*3/uL (ref 150–400)
RBC: 4.48 MIL/uL (ref 3.87–5.11)
RDW: 14.2 % (ref 11.5–15.5)
WBC: 11.6 10*3/uL — ABNORMAL HIGH (ref 4.0–10.5)

## 2017-04-28 LAB — MRSA PCR SCREENING: MRSA BY PCR: NEGATIVE

## 2017-04-28 SURGERY — RADIOLOGY WITH ANESTHESIA
Anesthesia: General

## 2017-04-28 MED ORDER — PANTOPRAZOLE SODIUM 40 MG IV SOLR
40.0000 mg | Freq: Every day | INTRAVENOUS | Status: DC
  Administered 2017-04-28: 40 mg via INTRAVENOUS
  Filled 2017-04-28: qty 40

## 2017-04-28 MED ORDER — SUGAMMADEX SODIUM 500 MG/5ML IV SOLN
INTRAVENOUS | Status: DC | PRN
  Administered 2017-04-28: 300 mg via INTRAVENOUS

## 2017-04-28 MED ORDER — ONDANSETRON HCL 4 MG/2ML IJ SOLN
4.0000 mg | INTRAMUSCULAR | Status: DC | PRN
  Administered 2017-04-28 – 2017-04-29 (×2): 4 mg via INTRAVENOUS
  Filled 2017-04-28 (×2): qty 2

## 2017-04-28 MED ORDER — LAMOTRIGINE 100 MG PO TABS
200.0000 mg | ORAL_TABLET | Freq: Every day | ORAL | Status: DC
  Filled 2017-04-28 (×2): qty 2

## 2017-04-28 MED ORDER — LABETALOL HCL 5 MG/ML IV SOLN
10.0000 mg | INTRAVENOUS | Status: DC | PRN
  Administered 2017-04-29: 20 mg via INTRAVENOUS
  Filled 2017-04-28: qty 4

## 2017-04-28 MED ORDER — FENTANYL CITRATE (PF) 100 MCG/2ML IJ SOLN
INTRAMUSCULAR | Status: DC | PRN
  Administered 2017-04-28 (×4): 50 ug via INTRAVENOUS

## 2017-04-28 MED ORDER — PROPOFOL 10 MG/ML IV BOLUS
INTRAVENOUS | Status: DC | PRN
  Administered 2017-04-28: 200 mg via INTRAVENOUS

## 2017-04-28 MED ORDER — LIDOCAINE HCL (CARDIAC) 20 MG/ML IV SOLN
INTRAVENOUS | Status: DC | PRN
  Administered 2017-04-28: 60 mg via INTRAVENOUS

## 2017-04-28 MED ORDER — MIDAZOLAM HCL 2 MG/2ML IJ SOLN
INTRAMUSCULAR | Status: AC
  Filled 2017-04-28: qty 2

## 2017-04-28 MED ORDER — DEXTROSE 5 % IV SOLN
3.0000 g | INTRAVENOUS | Status: AC
  Administered 2017-04-28: 3 g via INTRAVENOUS
  Filled 2017-04-28 (×2): qty 3000

## 2017-04-28 MED ORDER — LACTATED RINGERS IV SOLN
INTRAVENOUS | Status: DC
  Administered 2017-04-28: 11:00:00 via INTRAVENOUS

## 2017-04-28 MED ORDER — SODIUM CHLORIDE 0.9 % IV SOLN
INTRAVENOUS | Status: DC | PRN
  Administered 2017-04-28 (×2): via INTRAVENOUS

## 2017-04-28 MED ORDER — SODIUM CHLORIDE 0.9 % IV SOLN
INTRAVENOUS | Status: DC
  Administered 2017-04-28: 17:00:00 via INTRAVENOUS

## 2017-04-28 MED ORDER — HEPARIN SODIUM (PORCINE) 1000 UNIT/ML IJ SOLN
INTRAMUSCULAR | Status: DC | PRN
  Administered 2017-04-28: 2000 [IU] via INTRAVENOUS

## 2017-04-28 MED ORDER — ONDANSETRON HCL 4 MG/2ML IJ SOLN
INTRAMUSCULAR | Status: DC | PRN
  Administered 2017-04-28: 4 mg via INTRAVENOUS

## 2017-04-28 MED ORDER — LABETALOL HCL 5 MG/ML IV SOLN
INTRAVENOUS | Status: DC | PRN
  Administered 2017-04-28: 5 mg via INTRAVENOUS
  Administered 2017-04-28: 10 mg via INTRAVENOUS
  Administered 2017-04-28: 5 mg via INTRAVENOUS

## 2017-04-28 MED ORDER — FENTANYL CITRATE (PF) 100 MCG/2ML IJ SOLN
INTRAMUSCULAR | Status: AC
  Administered 2017-04-28: 50 ug via INTRAVENOUS
  Filled 2017-04-28: qty 2

## 2017-04-28 MED ORDER — DEXAMETHASONE SODIUM PHOSPHATE 10 MG/ML IJ SOLN
INTRAMUSCULAR | Status: DC | PRN
  Administered 2017-04-28: 4 mg via INTRAVENOUS

## 2017-04-28 MED ORDER — PROMETHAZINE HCL 25 MG/ML IJ SOLN: 6.2500 mg | INTRAMUSCULAR | Status: DC | PRN

## 2017-04-28 MED ORDER — LAMOTRIGINE 25 MG PO TABS
150.0000 mg | ORAL_TABLET | Freq: Every day | ORAL | Status: DC
  Administered 2017-04-29: 11:00:00 150 mg via ORAL
  Filled 2017-04-28: qty 2

## 2017-04-28 MED ORDER — CHLORHEXIDINE GLUCONATE CLOTH 2 % EX PADS: 6.0000 | MEDICATED_PAD | Freq: Once | CUTANEOUS | Status: DC

## 2017-04-28 MED ORDER — MORPHINE SULFATE (PF) 4 MG/ML IV SOLN
2.0000 mg | INTRAVENOUS | Status: DC | PRN
  Administered 2017-04-28 – 2017-04-29 (×2): 2 mg via INTRAVENOUS
  Administered 2017-04-29: 1 mg via INTRAVENOUS
  Administered 2017-04-29: 2 mg via INTRAVENOUS
  Filled 2017-04-28 (×4): qty 1

## 2017-04-28 MED ORDER — MIDAZOLAM HCL 5 MG/5ML IJ SOLN
INTRAMUSCULAR | Status: DC | PRN
  Administered 2017-04-28: 2 mg via INTRAVENOUS

## 2017-04-28 MED ORDER — OXYCODONE-ACETAMINOPHEN 5-325 MG PO TABS
1.0000 | ORAL_TABLET | ORAL | Status: DC | PRN
  Administered 2017-04-28: 2 via ORAL
  Filled 2017-04-28: qty 2

## 2017-04-28 MED ORDER — HEPARIN SODIUM (PORCINE) 5000 UNIT/ML IJ SOLN
5000.0000 [IU] | Freq: Three times a day (TID) | INTRAMUSCULAR | Status: DC
  Administered 2017-04-29: 5000 [IU] via SUBCUTANEOUS
  Filled 2017-04-28: qty 1

## 2017-04-28 MED ORDER — FENTANYL CITRATE (PF) 100 MCG/2ML IJ SOLN
INTRAMUSCULAR | Status: AC
  Filled 2017-04-28: qty 4

## 2017-04-28 MED ORDER — HYDROCODONE-ACETAMINOPHEN 5-325 MG PO TABS
1.0000 | ORAL_TABLET | ORAL | Status: DC | PRN
  Administered 2017-04-28: 2 via ORAL
  Filled 2017-04-28: qty 2

## 2017-04-28 MED ORDER — ONDANSETRON HCL 4 MG PO TABS: 4.0000 mg | ORAL_TABLET | ORAL | Status: DC | PRN

## 2017-04-28 MED ORDER — FENTANYL CITRATE (PF) 100 MCG/2ML IJ SOLN
25.0000 ug | INTRAMUSCULAR | Status: DC | PRN
  Administered 2017-04-28 (×2): 50 ug via INTRAVENOUS

## 2017-04-28 MED ORDER — PROCHLORPERAZINE 25 MG RE SUPP
25.0000 mg | Freq: Two times a day (BID) | RECTAL | Status: DC | PRN
  Administered 2017-04-28: 25 mg via RECTAL
  Filled 2017-04-28 (×2): qty 1

## 2017-04-28 MED ORDER — IOPAMIDOL (ISOVUE-300) INJECTION 61%
INTRAVENOUS | Status: AC
  Administered 2017-04-28: 20 mL
  Filled 2017-04-28: qty 150

## 2017-04-28 MED ORDER — TOPIRAMATE 100 MG PO TABS
100.0000 mg | ORAL_TABLET | Freq: Every day | ORAL | Status: DC
  Filled 2017-04-28 (×2): qty 1

## 2017-04-28 MED ORDER — LEVONORGESTREL 20 MCG/24HR IU IUD: 1.0000 | INTRAUTERINE_SYSTEM | Freq: Once | INTRAUTERINE | Status: DC

## 2017-04-28 MED ORDER — ROCURONIUM BROMIDE 100 MG/10ML IV SOLN
INTRAVENOUS | Status: DC | PRN
  Administered 2017-04-28: 60 mg via INTRAVENOUS
  Administered 2017-04-28: 20 mg via INTRAVENOUS

## 2017-04-28 MED ORDER — IMIPRAMINE HCL 10 MG PO TABS
30.0000 mg | ORAL_TABLET | Freq: Every day | ORAL | Status: DC
  Filled 2017-04-28 (×2): qty 3

## 2017-04-28 MED ORDER — DEXTROSE 5 % IV SOLN: 3.0000 g | INTRAVENOUS | Status: DC

## 2017-04-28 MED ORDER — PHENYLEPHRINE HCL 10 MG/ML IJ SOLN
INTRAVENOUS | Status: DC | PRN
  Administered 2017-04-28: 15 ug/min via INTRAVENOUS
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
## 2017-04-28 NOTE — Anesthesia Procedure Notes (Signed)
Procedure Name: Intubation Date/Time: 04/28/2017 12:21 PM Performed by: Inda Coke, CRNA Pre-anesthesia Checklist: Patient identified, Emergency Drugs available, Suction available and Patient being monitored Patient Re-evaluated:Patient Re-evaluated prior to induction Oxygen Delivery Method: Circle System Utilized Preoxygenation: Pre-oxygenation with 100% oxygen Induction Type: IV induction Ventilation: Mask ventilation without difficulty Laryngoscope Size: Mac and 3 Grade View: Grade II Tube type: Oral Tube size: 7.0 mm Number of attempts: 1 Airway Equipment and Method: Stylet and Oral airway Placement Confirmation: ETT inserted through vocal cords under direct vision,  positive ETCO2 and breath sounds checked- equal and bilateral Secured at: 21 cm Tube secured with: Tape Dental Injury: Teeth and Oropharynx as per pre-operative assessment
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
## 2017-04-28 NOTE — Transfer of Care (Signed)
Immediate Anesthesia Transfer of Care Note  Patient: Joanne Long  Procedure(s) Performed: Arteriogram, Onyx embolization of AVM (N/A )  Patient Location: PACU  Anesthesia Type:General  Level of Consciousness: awake, alert  and oriented  Airway & Oxygen Therapy: Patient Spontanous Breathing and Patient connected to nasal cannula oxygen  Post-op Assessment: Report given to RN, Post -op Vital signs reviewed and stable, Patient moving all extremities X 4 and Patient able to stick tongue midline  Post vital signs: Reviewed and stable  Last Vitals:  Vitals:   04/28/17 0957  BP: (!) 145/75  Pulse: 99  Resp: 20  Temp: 36.7 C  SpO2: 99%    Last Pain:  Vitals:   04/28/17 1027  PainSc: 3          Complications: No apparent anesthesia complications

## 2017-04-28 NOTE — Anesthesia Procedure Notes (Signed)
Arterial Line Insertion Start/End11/13/2018 11:30 AM, 04/28/2017 11:50 AM Performed by: Adair LaundryPaxton, Julies Carmickle A, CRNA, CRNA  Patient location: Pre-op. Preanesthetic checklist: patient identified, IV checked, risks and benefits discussed, surgical consent and timeout performed Lidocaine 1% used for infiltration Left, radial was placed Catheter size: 20 G Hand hygiene performed  and maximum sterile barriers used   Attempts: 1 Procedure performed without using ultrasound guided technique. Following insertion, dressing applied and Biopatch. Post procedure assessment: normal  Patient tolerated the procedure well with no immediate complications.

## 2017-04-28 NOTE — Progress Notes (Signed)
Patient nauseous and vomited 200 mL. Zofran had been given at 1830. MD notified. New order obtained and given. Pt again vomited when administering 2200 meds. MD notified. Per MD, will retry to give meds.

## 2017-04-28 NOTE — Anesthesia Postprocedure Evaluation (Signed)
Anesthesia Post Note  Patient: Davonna BellingKerri P Layfield  Procedure(s) Performed: Arteriogram, Onyx embolization of AVM (N/A )     Patient location during evaluation: PACU Anesthesia Type: General Level of consciousness: awake and alert Pain management: pain level controlled Vital Signs Assessment: post-procedure vital signs reviewed and stable Respiratory status: spontaneous breathing, nonlabored ventilation and respiratory function stable Cardiovascular status: blood pressure returned to baseline and stable Postop Assessment: no apparent nausea or vomiting Anesthetic complications: no    Last Vitals:  Vitals:   04/28/17 1630 04/28/17 1645  BP:  125/84  Pulse: 96 97  Resp: 20 15  Temp:  36.7 C  SpO2: 95% 95%    Last Pain:  Vitals:   04/28/17 1650  TempSrc:   PainSc: Asleep                 Beryle Lathehomas E Brock

## 2017-04-29 ENCOUNTER — Encounter (HOSPITAL_COMMUNITY): Payer: Self-pay | Admitting: Neurosurgery

## 2017-04-29 MED ORDER — INFLUENZA VAC SPLIT QUAD 0.5 ML IM SUSY: 0.5000 mL | PREFILLED_SYRINGE | INTRAMUSCULAR | Status: DC

## 2017-04-29 MED ORDER — SODIUM CHLORIDE 0.9 % IV SOLN
1000.0000 mg | Freq: Once | INTRAVENOUS | Status: AC
  Administered 2017-04-29: 1000 mg via INTRAVENOUS
  Filled 2017-04-29: qty 10

## 2017-04-29 MED ORDER — IMIPRAMINE HCL 10 MG PO TABS
30.0000 mg | ORAL_TABLET | Freq: Once | ORAL | Status: DC
  Filled 2017-04-29: qty 3

## 2017-04-29 MED ORDER — TOPIRAMATE 100 MG PO TABS: 100.0000 mg | ORAL_TABLET | Freq: Once | ORAL | Status: DC

## 2017-04-29 MED ORDER — LAMOTRIGINE 100 MG PO TABS: 200.0000 mg | ORAL_TABLET | Freq: Once | ORAL | Status: DC

## 2017-04-29 MED ORDER — OXYCODONE-ACETAMINOPHEN 5-325 MG PO TABS: 1.0000 | ORAL_TABLET | Freq: Three times a day (TID) | ORAL | 0 refills | Status: DC | PRN

## 2017-04-29 NOTE — Progress Notes (Signed)
Pt seen and examined. Was nauseated, vomited overnight. No other c/o N/T/W.  EXAM: Temp:  [97.8 F (36.6 C)-98.4 F (36.9 C)] 98.3 F (36.8 C) (11/14 0808) Pulse Rate:  [74-108] 89 (11/14 0700) Resp:  [14-31] 16 (11/14 0700) BP: (113-161)/(68-84) 123/79 (11/14 0700) SpO2:  [90 %-100 %] 100 % (11/14 0700) Arterial Line BP: (104-160)/(68-115) 136/78 (11/14 0700) Weight:  [130.5 kg (287 lb 9.6 oz)-133 kg (293 lb 3.4 oz)] 133 kg (293 lb 3.4 oz) (11/13 1545) Intake/Output      11/13 0701 - 11/14 0700 11/14 0701 - 11/15 0700   I.V. (mL/kg) 2375 (17.9) 75 (0.6)   IV Piggyback 110    Total Intake(mL/kg) 2485 (18.7) 75 (0.6)   Urine (mL/kg/hr) 2535    Emesis/NG output 200    Blood 10    Total Output 2745    Net -260 +75         Awake, alert, oriented Speech fluent CN intact, face symmetric, tongue midline, facial sensation intact, palate symmetric Good strength throughout, no drift No truncal ataxia  LABS: Lab Results  Component Value Date   CREATININE 0.86 04/28/2017   BUN 12 04/21/2017   NA 137 04/21/2017   K 4.3 04/21/2017   CL 104 04/21/2017   CO2 27 04/21/2017   Lab Results  Component Value Date   WBC 11.6 (H) 04/28/2017   HGB 12.3 04/28/2017   HCT 37.8 04/28/2017   MCV 84.4 04/28/2017   PLT 255 04/28/2017    IMPRESSION: - 41 y.o. female POD#1 s/p Onyx embolization cerebellar AVM, neurologically at baseline  PLAN: - d/c a-line, foley - OOB this am - attempt to progress diet - Hopefully d/c home today after the above.

## 2017-04-29 NOTE — Discharge Summary (Signed)
  Physician Discharge Summary  Patient ID: Joanne Long MRN: [id number] DOB/AGE: 41-Jan-1977 41 y.o.  Admit date: 04/28/2017 Discharge date: 04/29/2017  Joanne Long Diagnoses:  AVM of cerebellum  Discharge Diagnoses:  Same Active Problems:   Arteriovenous malformation of brain   Discharged Condition: Stable  Hospital Course:  Joanne DistanceKerri P Dunlop is a 41 y.o. female admitted after elective endovascular embolization of left cerebellar AVM. She was at neurologic baseline postop and d/c'ed in stable condition.  Treatments: Surgery - Onyx embolization left cerebellar AVM  Discharge Exam: Blood pressure 123/79, pulse 89, temperature 98.3 F (36.8 C), temperature source Oral, resp. rate 16, height 5\' 8"  (1.727 m), weight 133 kg (293 lb 3.4 oz), SpO2 100 %. Awake, alert, oriented Speech fluent, appropriate CN grossly intact 5/5 BUE/BLE Wound c/d/i  Disposition: 01-Home or Self Care  Discharge Instructions    Call MD for:  redness, tenderness, or signs of infection (pain, swelling, redness, odor or green/yellow discharge around incision site)   Complete by:  As directed    Call MD for:  temperature >100.4   Complete by:  As directed    Diet - low sodium heart healthy   Complete by:  As directed    Discharge instructions   Complete by:  As directed    Walk at home as much as possible, at least 4 times / day   Increase activity slowly   Complete by:  As directed    Lifting restrictions   Complete by:  As directed    No lifting > 10 lbs   May shower / Bathe   Complete by:  As directed    48 hours after surgery   May walk up steps   Complete by:  As directed    No dressing needed   Complete by:  As directed    Other Restrictions   Complete by:  As directed    No bending/twisting at waist     Allergies as of 04/29/2017   No Known Allergies     Medication List    TAKE these medications   imipramine 10 MG tablet Commonly known as:  TOFRANIL Take 30 mg at bedtime by  mouth.   lamoTRIgine 100 MG tablet Commonly known as:  LAMICTAL 150 mg every morning and 200 every night   levonorgestrel 20 MCG/24HR IUD Commonly known as:  MIRENA 1 each by Intrauterine route once.   oxyCODONE-acetaminophen 5-325 MG tablet Commonly known as:  PERCOCET/ROXICET Take 1 tablet every 8 (eight) hours as needed by mouth for severe pain.   topiramate 50 MG tablet Commonly known as:  TOPAMAX One tablet in the morning and 2 in the evening What changed:    how much to take  how to take this  when to take this  additional instructions      Follow-up Information    Lisbeth RenshawNundkumar, Lenix Kidd, MD Follow up in 4 week(s).   Specialty:  Neurosurgery Contact information: [address] [place] [address] [phone]           Signed: Jackelyn HoehnUNDKUMAR, Maahi Lannan, C 04/29/2017, 8:48 AM

## 2017-04-29 NOTE — Progress Notes (Signed)
Tech had discussed with Pt earlier about getting a bath. Tech just went into Pt's room inquiring about the bath. Pt states that she just wants to slowly keep eating for now, Tech will try again later.

## 2017-05-04 ENCOUNTER — Other Ambulatory Visit: Payer: Self-pay | Admitting: Neurosurgery

## 2017-05-04 DIAGNOSIS — Q2730 Arteriovenous malformation, site unspecified: Secondary | ICD-10-CM

## 2017-05-06 ENCOUNTER — Ambulatory Visit
Admission: RE | Admit: 2017-05-06 | Discharge: 2017-05-06 | Disposition: A | Discharge status: Home / Self Care | Payer: 59 | Source: Ambulatory Visit | Attending: Neurosurgery | Admitting: Neurosurgery

## 2017-05-06 DIAGNOSIS — Q2730 Arteriovenous malformation, site unspecified: Secondary | ICD-10-CM

## 2017-05-25 ENCOUNTER — Telehealth: Payer: Self-pay | Admitting: *Deleted
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
## 2017-05-26 ENCOUNTER — Ambulatory Visit: Payer: 59 | Admitting: Nurse Practitioner

## 2017-06-10 ENCOUNTER — Other Ambulatory Visit: Payer: Self-pay | Admitting: Sports Medicine

## 2017-06-10 DIAGNOSIS — M25531 Pain in right wrist: Secondary | ICD-10-CM

## 2017-06-26 ENCOUNTER — Ambulatory Visit
Admission: RE | Admit: 2017-06-26 | Discharge: 2017-06-26 | Disposition: A | Discharge status: Home / Self Care | Payer: 59 | Source: Ambulatory Visit | Attending: Sports Medicine | Admitting: Sports Medicine

## 2017-06-26 DIAGNOSIS — M25531 Pain in right wrist: Secondary | ICD-10-CM

## 2017-06-26 MED ORDER — IOPAMIDOL (ISOVUE-M 200) INJECTION 41%
2.0000 mL | Freq: Once | INTRAMUSCULAR | Status: AC
  Administered 2017-06-26: 2 mL via INTRA_ARTICULAR
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
## 2017-08-11 ENCOUNTER — Encounter: Payer: Self-pay | Admitting: Nurse Practitioner

## 2017-08-11 ENCOUNTER — Ambulatory Visit (INDEPENDENT_AMBULATORY_CARE_PROVIDER_SITE_OTHER): Payer: 59 | Admitting: Nurse Practitioner

## 2017-08-11 VITALS — BP 145/97 | HR 78 | Wt 292.4 lb

## 2017-08-11 DIAGNOSIS — G43809 Other migraine, not intractable, without status migrainosus: Secondary | ICD-10-CM

## 2017-08-11 DIAGNOSIS — G40909 Epilepsy, unspecified, not intractable, without status epilepticus: Secondary | ICD-10-CM | POA: Diagnosis not present

## 2017-08-11 MED ORDER — LAMOTRIGINE 100 MG PO TABS: ORAL_TABLET | ORAL | 1 refills | Status: DC

## 2017-08-11 MED ORDER — RIZATRIPTAN BENZOATE 10 MG PO TABS: ORAL_TABLET | ORAL | 1 refills | Status: DC

## 2017-08-11 NOTE — Progress Notes (Signed)
I have read the note, and I agree with the clinical assessment and plan.  Charles K Willis   

## 2017-08-11 NOTE — Patient Instructions (Signed)
Restart Topamax 50 mg at at bedtime for 2 weeks then 50 mg twice daily Continue Maxalt acutely will refill Continue Lamictal 150 mg every morning and 200 mg every night will refill Given a list of migraine triggers Follow-up in 6 months and as needed

## 2017-10-21 ENCOUNTER — Other Ambulatory Visit: Payer: Self-pay | Admitting: Obstetrics & Gynecology

## 2017-10-21 DIAGNOSIS — Z1231 Encounter for screening mammogram for malignant neoplasm of breast: Secondary | ICD-10-CM

## 2017-10-30 ENCOUNTER — Other Ambulatory Visit: Payer: Self-pay | Admitting: Neurosurgery

## 2017-10-30 DIAGNOSIS — Q2730 Arteriovenous malformation, site unspecified: Secondary | ICD-10-CM

## 2017-11-11 ENCOUNTER — Ambulatory Visit
Admission: RE | Admit: 2017-11-11 | Discharge: 2017-11-11 | Disposition: A | Discharge status: Home / Self Care | Payer: 59 | Source: Ambulatory Visit | Attending: Obstetrics & Gynecology | Admitting: Obstetrics & Gynecology

## 2017-11-11 DIAGNOSIS — Z1231 Encounter for screening mammogram for malignant neoplasm of breast: Secondary | ICD-10-CM

## 2017-11-13 ENCOUNTER — Other Ambulatory Visit: Payer: Self-pay | Admitting: Obstetrics & Gynecology

## 2017-11-13 DIAGNOSIS — R928 Other abnormal and inconclusive findings on diagnostic imaging of breast: Secondary | ICD-10-CM

## 2017-11-20 ENCOUNTER — Other Ambulatory Visit: Payer: Self-pay | Admitting: Neurosurgery

## 2017-11-23 ENCOUNTER — Other Ambulatory Visit: Payer: Self-pay | Admitting: Student

## 2017-11-24 ENCOUNTER — Encounter (HOSPITAL_COMMUNITY): Payer: Self-pay | Admitting: Neurosurgery

## 2017-11-24 ENCOUNTER — Other Ambulatory Visit: Payer: Self-pay | Admitting: Neurosurgery

## 2017-11-24 ENCOUNTER — Ambulatory Visit (HOSPITAL_COMMUNITY)
Admission: RE | Admit: 2017-11-24 | Discharge: 2017-11-24 | Disposition: A | Discharge status: Home / Self Care | Payer: 59 | Source: Ambulatory Visit | Attending: Neurosurgery | Admitting: Neurosurgery

## 2017-11-24 DIAGNOSIS — Q2730 Arteriovenous malformation, site unspecified: Secondary | ICD-10-CM

## 2017-11-24 DIAGNOSIS — Z09 Encounter for follow-up examination after completed treatment for conditions other than malignant neoplasm: Secondary | ICD-10-CM | POA: Diagnosis not present

## 2017-11-24 DIAGNOSIS — Z8774 Personal history of (corrected) congenital malformations of heart and circulatory system: Secondary | ICD-10-CM | POA: Diagnosis not present

## 2017-11-24 DIAGNOSIS — G43909 Migraine, unspecified, not intractable, without status migrainosus: Secondary | ICD-10-CM | POA: Diagnosis not present

## 2017-11-24 DIAGNOSIS — G40909 Epilepsy, unspecified, not intractable, without status epilepticus: Secondary | ICD-10-CM | POA: Insufficient documentation

## 2017-11-24 DIAGNOSIS — R112 Nausea with vomiting, unspecified: Secondary | ICD-10-CM | POA: Diagnosis not present

## 2017-11-24 DIAGNOSIS — F411 Generalized anxiety disorder: Secondary | ICD-10-CM | POA: Diagnosis not present

## 2017-11-24 HISTORY — PX: IR ANGIO VERTEBRAL SEL VERTEBRAL BILAT MOD SED: IMG5369

## 2017-11-24 HISTORY — PX: IR ANGIO INTRA EXTRACRAN SEL COM CAROTID INNOMINATE BILAT MOD SED: IMG5360

## 2017-11-24 LAB — CBC WITH DIFFERENTIAL/PLATELET
Abs Immature Granulocytes: 0 10*3/uL (ref 0.0–0.1)
Basophils Absolute: 0.1 10*3/uL (ref 0.0–0.1)
Basophils Relative: 1 %
EOS PCT: 0 %
Eosinophils Absolute: 0 10*3/uL (ref 0.0–0.7)
HEMATOCRIT: 41.3 % (ref 36.0–46.0)
Hemoglobin: 13 g/dL (ref 12.0–15.0)
Immature Granulocytes: 1 %
Lymphocytes Relative: 27 %
Lymphs Abs: 2.3 10*3/uL (ref 0.7–4.0)
MCH: 25.7 pg — AB (ref 26.0–34.0)
MCHC: 31.5 g/dL (ref 30.0–36.0)
MCV: 81.8 fL (ref 78.0–100.0)
Monocytes Absolute: 0.6 10*3/uL (ref 0.1–1.0)
Monocytes Relative: 6 %
Neutro Abs: 5.7 10*3/uL (ref 1.7–7.7)
Neutrophils Relative %: 65 %
Platelets: 291 10*3/uL (ref 150–400)
RBC: 5.05 MIL/uL (ref 3.87–5.11)
RDW: 14.1 % (ref 11.5–15.5)
WBC: 8.6 10*3/uL (ref 4.0–10.5)

## 2017-11-24 LAB — BASIC METABOLIC PANEL
Anion gap: 7 (ref 5–15)
BUN: 12 mg/dL (ref 6–20)
CO2: 27 mmol/L (ref 22–32)
Calcium: 9 mg/dL (ref 8.9–10.3)
Chloride: 106 mmol/L (ref 101–111)
Creatinine, Ser: 0.91 mg/dL (ref 0.44–1.00)
GFR calc Af Amer: >60 mL/min (ref >60)
Glucose, Bld: 99 mg/dL (ref 65–99)
Potassium: 3.9 mmol/L (ref 3.5–5.1)
SODIUM: 140 mmol/L (ref 135–145)

## 2017-11-24 LAB — PROTIME-INR
INR: 0.98
PROTHROMBIN TIME: 12.9 s (ref 11.4–15.2)

## 2017-11-24 LAB — APTT: APTT: 30 s (ref 24–36)

## 2017-11-24 LAB — PREGNANCY, URINE: Preg Test, Ur: NEGATIVE

## 2017-11-24 MED ORDER — HYDROCODONE-ACETAMINOPHEN 5-325 MG PO TABS: 1.0000 | ORAL_TABLET | ORAL | Status: DC | PRN

## 2017-11-24 MED ORDER — HEPARIN SODIUM (PORCINE) 1000 UNIT/ML IJ SOLN
INTRAMUSCULAR | Status: AC | PRN
  Administered 2017-11-24: 2000 [IU] via INTRAVENOUS

## 2017-11-24 MED ORDER — MIDAZOLAM HCL 2 MG/2ML IJ SOLN
INTRAMUSCULAR | Status: AC | PRN
  Administered 2017-11-24: 1 mg via INTRAVENOUS

## 2017-11-24 MED ORDER — FENTANYL CITRATE (PF) 100 MCG/2ML IJ SOLN
INTRAMUSCULAR | Status: AC | PRN
  Administered 2017-11-24: 25 ug via INTRAVENOUS

## 2017-11-24 MED ORDER — IOPAMIDOL (ISOVUE-300) INJECTION 61%
INTRAVENOUS | Status: AC
  Filled 2017-11-24: qty 100

## 2017-11-24 MED ORDER — LIDOCAINE HCL (PF) 1 % IJ SOLN
INTRAMUSCULAR | Status: AC | PRN
  Administered 2017-11-24: 10 mL

## 2017-11-24 MED ORDER — SODIUM CHLORIDE 0.9 % IV SOLN: INTRAVENOUS | Status: DC

## 2017-11-24 MED ORDER — MIDAZOLAM HCL 2 MG/2ML IJ SOLN
INTRAMUSCULAR | Status: AC
  Filled 2017-11-24: qty 2

## 2017-11-24 MED ORDER — FENTANYL CITRATE (PF) 100 MCG/2ML IJ SOLN
INTRAMUSCULAR | Status: AC
  Filled 2017-11-24: qty 2

## 2017-11-24 MED ORDER — CEFAZOLIN SODIUM-DEXTROSE 2-4 GM/100ML-% IV SOLN: 2.0000 g | INTRAVENOUS | Status: DC

## 2017-11-24 MED ORDER — LIDOCAINE HCL 1 % IJ SOLN
INTRAMUSCULAR | Status: AC
  Filled 2017-11-24: qty 20

## 2017-11-24 MED ORDER — HEPARIN SODIUM (PORCINE) 1000 UNIT/ML IJ SOLN
INTRAMUSCULAR | Status: AC
  Filled 2017-11-24: qty 1

## 2017-11-24 NOTE — H&P (Addendum)
Chief Complaint   AVM  HPI   HPI: Joanne Long is a 42 y.o. 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 She had uncomplicated post procedural course with the exception of nausea, vomiting and headache. She presents today for repeat diagnostic cerebral angiogram. She is without any concerns.     Patient Active Problem List   Diagnosis Date Noted  . 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 Cerebellar hemorrhage, acute ([place]) 01/31/2017  . Cerebellar hemorrhage ([place]) 01/31/2017  . Anxiety state 06/28/2015  . Epilepsy ([place]) 06/28/2015  . Migraine 06/28/2015    PMH: Past Medical History:  Diagnosis Date  . Anemia   . Anxiety   . Migraine   . Seizures ([place])    years ago    PSH: Past Surgical History:  Procedure Laterality Date  . BACK SURGERY     Lumbar  . IR ANGIO EXTERNAL CAROTID SEL EXT CAROTID BILAT MOD SED  03/06/2017  . IR ANGIO INTRA EXTRACRAN SEL INTERNAL CAROTID BILAT MOD SED  03/06/2017  . IR ANGIO VERTEBRAL SEL VERTEBRAL BILAT MOD SED  03/06/2017  . IR ANGIO VERTEBRAL SEL VERTEBRAL UNI L MOD SED  04/28/2017  . IR NEURO EACH ADD'L AFTER BASIC UNI LEFT (MS)  04/28/2017  . IR TRANSCATH/EMBOLIZ  04/28/2017  . RADIOLOGY WITH ANESTHESIA N/A 04/28/2017   Procedure: Arteriogram, Onyx embolization of AVM;  Surgeon: Consuella Lose, MD;  Location: [facility];  Service: Radiology;  Laterality: N/A;  . TONSILLECTOMY    . WISDOM TOOTH EXTRACTION       (Not in a hospital admission)  SH: Social History   Tobacco Use  . Smoking status: Never Smoker  . Smokeless tobacco: Never Used  Substance Use Topics  . Alcohol use: No    Alcohol/week: 0.0 oz  . Drug use: No    MEDS: Prior to Admission medications   Medication Sig Start Date End Date Taking? Authorizing Provider  imipramine (TOFRANIL) 10 MG tablet Take 30 mg at bedtime by mouth.    Yes [provider]  lamoTRIgine (LAMICTAL)  100 MG tablet 150 mg every morning and 200 every night 08/11/17  Yes Dennie Bible, NP  topiramate (TOPAMAX) 50 MG tablet One tablet in the morning and 2 in the evening Patient taking differently: 50 mg. One tablet at hs for 2 weeks then 1 tab twice daily 03/09/17  Yes Kathrynn Ducking, MD  ibuprofen (ADVIL,MOTRIN) 800 MG tablet 800 mg as needed.    [provider]  levonorgestrel (MIRENA) 20 MCG/24HR IUD 1 each by Intrauterine route once.    [provider]  rizatriptan (MAXALT) 10 MG tablet TAKE 1 TABLET FOR MIGRAINE. MAY REPEAT IN 2 HOURS. NO MORE THAN 2/24 HOURS OR 2/3 DAYS 08/11/17   Dennie Bible, NP    ALLERGY: No Known Allergies  Social History   Tobacco Use  . Smoking status: Never Smoker  . Smokeless tobacco: Never Used  Substance Use Topics  . Alcohol use: No    Alcohol/week: 0.0 oz     Family History  Problem Relation Age of Onset  . Crohn's disease Sister   . Diabetes Brother   . Other Brother        hypoglycemic  . Thyroid disease Neg Hx      ROS   Review of Systems  Constitutional: Negative.   Eyes: Negative.   Respiratory: Negative.   Cardiovascular: Negative.   Gastrointestinal: Positive  for nausea and vomiting.  Genitourinary: Negative.   Musculoskeletal: Negative.   Skin: Negative.   Neurological: Positive for headaches. Negative for dizziness, tingling, tremors, sensory change, speech change, focal weakness, seizures, loss of consciousness and weakness.    Exam   Vitals:   11/24/17 0819  BP: (!) 117/95  Pulse: 82  Temp: 97.7 F (36.5 C)  SpO2: 98%   General appearance: WDWN Eyes: PERRL, Fundoscopic: normal Cardiovascular: Regular rate and rhythm without murmurs, rubs, gallops. No edema or variciosities. Distal pulses normal. Pulmonary: Clear to auscultation Musculoskeletal:     Gait normal    Muscle tone upper extremities: Normal    Muscle tone lower extremities: Normal    Motor exam: Upper Extremities  Deltoid Bicep Tricep Grip  Right 5/5 5/5 5/5 5/5  Left 5/5 5/5 5/5 5/5   Lower Extremity IP Quad PF DF EHL  Right 5/5 5/5 5/5 5/5 5/5  Left 5/5 5/5 5/5 5/5 5/5   Neurological Awake, alert, oriented Memory and concentration grossly intact Speech fluent, appropriate CNII: Visual fields normal CNIII/IV/VI: EOMI CNV: Facial sensation normal CNVII: Symmetric, normal strength CNVIII: Grossly normal CNIX: Normal palate movement CNXI: Trap and SCM strength normal CN XII: Tongue protrusion normal Sensation grossly intact to LT DTR: Normal Coordination (finger/nose & heel/shin): Normal  Results - Imaging/Labs   Results for orders placed or performed during the hospital encounter of 11/24/17 (from the past 48 hour(s))  APTT     Status: None   Collection Time: 11/24/17  8:56 AM  Result Value Ref Range   aPTT 30 24 - 36 seconds    Comment: Performed at [facility], [place] [address], [place], [place] [address]  Basic metabolic panel     Status: None   Collection Time: 11/24/17  8:56 AM  Result Value Ref Range   Sodium 140 135 - 145 mmol/L   Potassium 3.9 3.5 - 5.1 mmol/L   Chloride 106 101 - 111 mmol/L   CO2 27 22 - 32 mmol/L   Glucose, Bld 99 65 - 99 mg/dL   BUN 12 6 - 20 mg/dL   Creatinine, Ser 0.91 0.44 - 1.00 mg/dL   Calcium 9.0 8.9 - 10.3 mg/dL   GFR calc non Af Amer >60 >60 mL/min   GFR calc Af Amer >60 >60 mL/min    Comment: (NOTE) The eGFR has been calculated using the CKD EPI equation. This calculation has not been validated in all clinical situations. eGFR's persistently <60 mL/min signify possible Chronic Kidney Disease.    Anion gap 7 5 - 15    Comment: Performed at [facility] [address], [place], [place] [address]  CBC WITH DIFFERENTIAL     Status: Abnormal   Collection Time: 11/24/17  8:56 AM  Result Value Ref Range   WBC 8.6 4.0 - 10.5 K/uL   RBC 5.05 3.87 - 5.11 MIL/uL   Hemoglobin 13.0 12.0 - 15.0 g/dL   HCT 41.3 36.0 - 46.0 %   MCV  81.8 78.0 - 100.0 fL   MCH 25.7 (L) 26.0 - 34.0 pg   MCHC 31.5 30.0 - 36.0 g/dL   RDW 14.1 11.5 - 15.5 %   Platelets 291 150 - 400 K/uL   Neutrophils Relative % 65 %   Neutro Abs 5.7 1.7 - 7.7 K/uL   Lymphocytes Relative 27 %   Lymphs Abs 2.3 0.7 - 4.0 K/uL   Monocytes Relative 6 %   Monocytes Absolute 0.6 0.1 -  1.0 K/uL   Eosinophils Relative 0 %   Eosinophils Absolute 0.0 0.0 - 0.7 K/uL   Basophils Relative 1 %   Basophils Absolute 0.1 0.0 - 0.1 K/uL   Immature Granulocytes 1 %   Abs Immature Granulocytes 0.0 0.0 - 0.1 K/uL    Comment: Performed at [facility] [address] [place], [place] [address]  Protime-INR     Status: None   Collection Time: 11/24/17  8:56 AM  Result Value Ref Range   Prothrombin Time 12.9 11.4 - 15.2 seconds   INR 0.98     Comment: Performed at [facility] [address], [place], [place] [address]  Pregnancy, urine     Status: None   Collection Time: 11/24/17  8:57 AM  Result Value Ref Range   Preg Test, Ur NEGATIVE NEGATIVE    Comment:        THE SENSITIVITY OF THIS METHODOLOGY IS >20 mIU/mL. Performed at [facility], [place] [address], [place],  [address]     No results found.  Impression/Plan   - 42 y.o. female s/p embolization of left cerebellar AVM 04/2017. She presents today for repeat diagnostic angio for routine monitoring. The risks, benefits and alternatives to procedure were discussed. Patient states in own language understanding and wishes to proceed. All questions answered. Consent signed.

## 2017-11-24 NOTE — Discharge Instructions (Addendum)

## 2017-11-26 ENCOUNTER — Ambulatory Visit
Admission: RE | Admit: 2017-11-26 | Discharge: 2017-11-26 | Disposition: A | Discharge status: Home / Self Care | Payer: 59 | Source: Ambulatory Visit | Attending: Obstetrics & Gynecology | Admitting: Obstetrics & Gynecology

## 2017-11-26 DIAGNOSIS — R928 Other abnormal and inconclusive findings on diagnostic imaging of breast: Secondary | ICD-10-CM

## 2017-12-10 ENCOUNTER — Other Ambulatory Visit: Payer: Self-pay | Admitting: Neurosurgery

## 2017-12-11 ENCOUNTER — Other Ambulatory Visit: Payer: Self-pay | Admitting: Neurosurgery

## 2017-12-11 DIAGNOSIS — Q2730 Arteriovenous malformation, site unspecified: Secondary | ICD-10-CM

## 2017-12-16 ENCOUNTER — Telehealth: Payer: Self-pay | Admitting: Neurology

## 2017-12-16 NOTE — Telephone Encounter (Signed)
Dr. Anne HahnWillis- FYI I called pt and scheduled appt for 01/13/18 at 730am with you. She is having suboccipital craniotomy recess of cerebellar avm.

## 2017-12-16 NOTE — Telephone Encounter (Signed)
Pt is having brain surgery 8/20 and is wanting to be seen prior. She had appt with Joanne Long on 8/26. She will take a 7:30 with Joanne Long any date. Please call to advise

## 2017-12-18 ENCOUNTER — Ambulatory Visit
Admission: RE | Admit: 2017-12-18 | Discharge: 2017-12-18 | Disposition: A | Discharge status: Home / Self Care | Payer: 59 | Source: Ambulatory Visit | Attending: Neurosurgery | Admitting: Neurosurgery

## 2017-12-18 DIAGNOSIS — Q2730 Arteriovenous malformation, site unspecified: Secondary | ICD-10-CM

## 2017-12-18 MED ORDER — GADOBENATE DIMEGLUMINE 529 MG/ML IV SOLN
20.0000 mL | Freq: Once | INTRAVENOUS | Status: AC | PRN
  Administered 2017-12-18: 20 mL via INTRAVENOUS

## 2018-01-08 ENCOUNTER — Other Ambulatory Visit: Payer: Self-pay | Admitting: Neurosurgery

## 2018-01-13 ENCOUNTER — Encounter: Payer: Self-pay | Admitting: Neurology

## 2018-01-13 ENCOUNTER — Telehealth: Payer: Self-pay | Admitting: *Deleted

## 2018-01-13 ENCOUNTER — Ambulatory Visit (INDEPENDENT_AMBULATORY_CARE_PROVIDER_SITE_OTHER): Payer: 59 | Admitting: Neurology

## 2018-01-13 VITALS — BP 132/91 | HR 80 | Ht 68.0 in | Wt 300.5 lb

## 2018-01-13 DIAGNOSIS — G43809 Other migraine, not intractable, without status migrainosus: Secondary | ICD-10-CM

## 2018-01-13 DIAGNOSIS — Q282 Arteriovenous malformation of cerebral vessels: Secondary | ICD-10-CM

## 2018-01-13 DIAGNOSIS — Z0289 Encounter for other administrative examinations: Secondary | ICD-10-CM

## 2018-01-13 DIAGNOSIS — G40909 Epilepsy, unspecified, not intractable, without status epilepticus: Secondary | ICD-10-CM | POA: Diagnosis not present

## 2018-01-13 MED ORDER — TOPIRAMATE 100 MG PO TABS: 100.0000 mg | ORAL_TABLET | Freq: Two times a day (BID) | ORAL | 3 refills | Status: DC

## 2018-01-13 MED ORDER — RIZATRIPTAN BENZOATE 10 MG PO TABS: ORAL_TABLET | ORAL | 1 refills | Status: DC

## 2018-01-13 MED ORDER — DEXAMETHASONE 2 MG PO TABS: ORAL_TABLET | ORAL | 0 refills | Status: DC

## 2018-01-13 NOTE — Patient Instructions (Signed)
We will go up on the Topamax to 100 mg twice a day, if no benefit within 6 weeks, call and we will start another medication for the headache.  Use decadron prescription if the depacon injection did not help.

## 2018-01-13 NOTE — Progress Notes (Signed)
Reason for visit: Headache, seizures  Joanne Long is an 42 y.o. female  History of present illness:  Joanne Long is a 42 year old right-handed [name] with a history of seizures, migraine headache, and obesity.  The patient has a left cerebellar AVM.  This AVM was embolized by Dr. Conchita Paris on 28 April 2017.  This appeared to be successful initially, but follow-up cerebral angiogram done in June 2019 showed that the AVM had reformed with dural arterial sources.  The patient is planned for a suboccipital craniotomy with resection of the AVM around 02 February 2018.  The patient comes in today indicating that her headaches have increased in frequency.  The patient is averaging about 9 headache days a month, 6 of these are more severe.  The patient has had a headache for about 4 days at this point.  She does have some nausea with a headache, not severe.  She may have some blurring of vision.  She indicates that her usual headaches do come from the left occipital area.  The patient has been on Topamax but she does not believe that she has gained much benefit from this, currently on 150 mg daily.  The patient takes Maxalt if needed.  She is on Lamictal for her seizures.  The patient has been under some stress with the upcoming surgery.  Past Medical History:  Diagnosis Date  . Anemia   . Anxiety   . Migraine   . Seizures (HCC)    years ago    Past Surgical History:  Procedure Laterality Date  . BACK SURGERY     Lumbar  . IR ANGIO EXTERNAL CAROTID SEL EXT CAROTID BILAT MOD SED  03/06/2017  . IR ANGIO INTRA EXTRACRAN SEL COM CAROTID INNOMINATE BILAT MOD SED  11/24/2017  . IR ANGIO INTRA EXTRACRAN SEL INTERNAL CAROTID BILAT MOD SED  03/06/2017  . IR ANGIO VERTEBRAL SEL VERTEBRAL BILAT MOD SED  03/06/2017  . IR ANGIO VERTEBRAL SEL VERTEBRAL BILAT MOD SED  11/24/2017  . IR ANGIO VERTEBRAL SEL VERTEBRAL UNI L MOD SED  04/28/2017  . IR NEURO EACH ADD'L AFTER BASIC UNI LEFT (MS)  04/28/2017  .  IR TRANSCATH/EMBOLIZ  04/28/2017  . RADIOLOGY WITH ANESTHESIA N/A 04/28/2017   Procedure: Arteriogram, Onyx embolization of AVM;  Surgeon: Lisbeth Renshaw, MD;  Location: [facility] OR;  Service: Radiology;  Laterality: N/A;  . TONSILLECTOMY    . WISDOM TOOTH EXTRACTION      Family History  Problem Relation Age of Onset  . Crohn's disease Sister   . Diabetes Brother   . Other Brother        hypoglycemic  . Thyroid disease Neg Hx     Social history:  reports that she has never smoked. She has never used smokeless tobacco. She reports that she does not drink alcohol or use drugs.   No Known Allergies  Medications:  Prior to Admission medications   Medication Sig Start Date End Date Taking? Authorizing Provider  clobetasol (TEMOVATE) 0.05 % external solution clobetasol 0.05 % scalp solutionprn   Yes [provider]  ibuprofen (ADVIL,MOTRIN) 800 MG tablet 800 mg as needed.   Yes [provider]  lamoTRIgine (LAMICTAL) 100 MG tablet 150 mg every morning and 200 every night 08/11/17  Yes Nilda Riggs, NP  levonorgestrel (MIRENA) 20 MCG/24HR IUD 1 each by Intrauterine route once.   Yes [provider]  rizatriptan (MAXALT) 10 MG tablet TAKE 1 TABLET FOR MIGRAINE. MAY REPEAT  IN 2 HOURS. NO MORE THAN 2/24 HOURS OR 2/3 DAYS 08/11/17  Yes Nilda RiggsMartin, Nancy Carolyn, NP  topiramate (TOPAMAX) 50 MG tablet One tablet in the morning and 2 in the evening Patient taking differently: 50 mg. One tablet at hs for 2 weeks then 1 tab twice daily 03/09/17  Yes York SpanielWillis, Charles K, MD    ROS:  Out of a complete 14 system review of symptoms, the patient complains only of the following symptoms, and all other reviewed systems are negative.  Fatigue Insomnia Headache  Blood pressure (!) 132/91, pulse 80, height 5\' 8"  (1.727 m), weight (!) 300 lb 8 oz (136.3 kg).  Physical Exam  General: The patient is alert and cooperative at the time of the examination.  The patient is markedly  obese.  Skin: No significant peripheral edema is noted.   Neurologic Exam  Mental status: The patient is alert and oriented x 3 at the time of the examination. The patient has apparent normal recent and remote memory, with an apparently normal attention span and concentration ability.   Cranial nerves: Facial symmetry is present. Speech is normal, no aphasia or dysarthria is noted. Extraocular movements are full. Visual fields are full.  Motor: The patient has good strength in all 4 extremities.  Sensory examination: Soft touch sensation is symmetric on the face, arms, and legs.  Coordination: The patient has good finger-nose-finger and heel-to-shin bilaterally.  Gait and station: The patient has a normal gait. Tandem gait is normal. Romberg is negative. No drift is seen.  Reflexes: Deep tendon reflexes are symmetric.   MRI brain 12/18/17:  IMPRESSION: 1. Signal abnormality in the posterior fossa related to treatment of known small cerebellar AVM and prior hemorrhage with mild encephalomalacia. 2. Otherwise unremarkable appearance of the brain. No acute intracranial abnormality.  * MRI scan images were reviewed online. I agree with the written report.   Cerebral angiogram 11/24/17:  IMPRESSION: 1. Persistent filling of a small left cerebellar arteriovenous malformation 6 months after embolization of left pica feeders. Supply remains through branches of the right PICA and there is development of feeders from the left middle meningeal artery not present on initial angiogram in September 2018.    Assessment/Plan:  1.  Migraine headache  2.  Left cerebellar AVM  3.  History of seizures, well controlled  The patient will remain on Lamictal.  A prescription was given for Maxalt, the Topamax will be increased to 100 mg twice daily.  If this dose is not effective, the patient will be tapered off of Topamax and will be placed on propranolol.  She will follow-up in 6 months.   She will remain on Lamictal.  A Depacon injection will be done today.  A prescription was given for Decadron for 3-day taper if the Depacon is not effective.  Joanne Long. Keith Willis MD 01/13/2018 7:25 AM  [facility] [address] [place], [place] [id number]  Phone [phone] Fax [phone]

## 2018-01-13 NOTE — Telephone Encounter (Signed)
Pt FMLA paper work on [employer] desk.

## 2018-01-14 ENCOUNTER — Telehealth: Payer: Self-pay | Admitting: *Deleted

## 2018-01-14 NOTE — Telephone Encounter (Signed)
Gave completed/signed FMLA back to medical records to process for pt.  Dr. Anne HahnWillis provided pt with the following:  For follow up treatment: 1 appt every 6 months. 4 hr per appt. Flare ups: up to 16hr per month. Does not have to be consecutive.

## 2018-01-14 NOTE — Telephone Encounter (Signed)
I called pt lvm pt FMLA paper work @ the front desk for p/u

## 2018-01-22 NOTE — Pre-Procedure Instructions (Signed)
Joanne Long  01/22/2018      [id number] - [place], [place] - [address] [place] [address] Phone: [phone] Fax: [phone]    Your procedure is scheduled on Tuesday, August 20th   [facility] at 5:30 AM             (posted surgery time 7:30a - 11:37a)   Call this number if you have problems the morning of surgery:  [phone]   Remember:   Do not eat any foods or drink any liquids after midnight, Monday.              7 days prior to surgery, STOP TAKING any Vitamins, Herbal Supplements, Anti-inflammatories.    Take these medicines the morning of surgery with A SIP OF WATER :  Lamictal    Do not wear jewelry, make-up or nail polish.  Do not wear lotions, powders, perfumes, or deodorant.  Do not shave 48 hours prior to surgery.  Do not bring valuables to the hospital.  [facility] is not responsible for any belongings or valuables.  Contacts, dentures or bridgework may not be worn into surgery.  Leave your suitcase in the car.  After surgery it may be brought to your room.  For patients admitted to the hospital, discharge time will be determined by your treatment team.  Please read over the following fact sheets that you were given. Pain Booklet and Surgical Site Infection Prevention       [place]- Preparing For Surgery  Before surgery, you can play an important role. Because skin is not sterile, your skin needs to be as free of germs as possible. You can reduce the number of germs on your skin by washing with CHG (chlorahexidine gluconate) Soap before surgery.  CHG is an antiseptic cleaner which kills germs and bonds with the skin to continue killing germs even after washing.    Oral Hygiene is also important to reduce your risk of infection.     Remember - BRUSH YOUR TEETH THE MORNING OF SURGERY WITH YOUR REGULAR TOOTHPASTE  Please do not use if  you have an allergy to CHG or antibacterial soaps. If your skin becomes reddened/irritated stop using the CHG.  Do not shave (including legs and underarms) for at least 48 hours prior to first CHG shower. It is OK to shave your face.  Please follow these instructions carefully.   1. Shower the NIGHT BEFORE SURGERY and the MORNING OF SURGERY with CHG.   2. If you chose to wash your hair, wash your hair first as usual with your normal shampoo.  3. After you shampoo, rinse your hair and body thoroughly to remove the shampoo.  4. Use CHG as you would any other liquid soap. You can apply CHG directly to the skin and wash gently with a scrungie or a clean washcloth.   5. Apply the CHG Soap to your body ONLY FROM THE NECK DOWN.  Do not use on open wounds or open sores. Avoid contact with your eyes, ears, mouth and genitals (private parts). Wash Face and genitals (private parts)  with your normal soap.  6. Wash thoroughly, paying special attention to the area where your surgery will be performed.  7. Thoroughly rinse your body with warm water from the neck down.  8. DO NOT shower/wash with your normal soap after using and rinsing off the CHG Soap.  9. Pat yourself dry with a CLEAN TOWEL.  10. Wear CLEAN PAJAMAS to bed the night before surgery, wear comfortable clothes the morning of surgery  11. Place CLEAN SHEETS on your bed the night of your first shower and DO NOT SLEEP WITH PETS.  Day of Surgery:  Do not apply any deodorants/lotions.  Please wear clean clothes to the hospital/surgery center.    Remember to brush your teeth WITH YOUR REGULAR TOOTHPASTE.

## 2018-01-25 ENCOUNTER — Encounter (HOSPITAL_COMMUNITY): Payer: Self-pay

## 2018-01-25 ENCOUNTER — Encounter (HOSPITAL_COMMUNITY)
Admission: RE | Admit: 2018-01-25 | Discharge: 2018-01-25 | Disposition: A | Discharge status: Home / Self Care | Payer: 59 | Source: Ambulatory Visit | Attending: Neurosurgery | Admitting: Neurosurgery

## 2018-01-25 DIAGNOSIS — Z01812 Encounter for preprocedural laboratory examination: Secondary | ICD-10-CM | POA: Insufficient documentation

## 2018-01-25 DIAGNOSIS — Z0183 Encounter for blood typing: Secondary | ICD-10-CM | POA: Diagnosis not present

## 2018-01-25 LAB — URINALYSIS, ROUTINE W REFLEX MICROSCOPIC
BILIRUBIN URINE: NEGATIVE
Bacteria, UA: NONE SEEN
GLUCOSE, UA: NEGATIVE mg/dL
KETONES UR: NEGATIVE mg/dL
Nitrite: NEGATIVE
PH: 5 (ref 5.0–8.0)
PROTEIN: NEGATIVE mg/dL
Specific Gravity, Urine: 1.02 (ref 1.005–1.030)

## 2018-01-25 LAB — TYPE AND SCREEN
ABO/RH(D): A POS
ANTIBODY SCREEN: NEGATIVE

## 2018-01-25 LAB — CBC
HCT: 41.8 % (ref 36.0–46.0)
HEMOGLOBIN: 13.3 g/dL (ref 12.0–15.0)
MCH: 26.7 pg (ref 26.0–34.0)
MCHC: 31.8 g/dL (ref 30.0–36.0)
MCV: 83.8 fL (ref 78.0–100.0)
PLATELETS: 278 10*3/uL (ref 150–400)
RBC: 4.99 MIL/uL (ref 3.87–5.11)
RDW: 14 % (ref 11.5–15.5)
WBC: 11.2 10*3/uL — ABNORMAL HIGH (ref 4.0–10.5)

## 2018-01-25 NOTE — Pre-Procedure Instructions (Signed)
Joanne Long  01/25/2018      [id number] - [place], [place] - [address] [facility] [address] [place] [address] Phone: [phone] Fax: [phone]    Your procedure is scheduled on Tuesday, August 20th   [facility] at 5:30 AM             (posted surgery time 7:30a - 11:37a)   Call this number if you have problems the morning of surgery:  [phone]   Remember:   Do not eat any foods or drink any liquids after midnight, Monday.              7 days prior to surgery, STOP TAKING any Vitamins, Herbal Supplements, Anti-inflammatories.    Take these medicines the morning of surgery with A SIP OF WATER :  Lamictal, Topramax    Do not wear jewelry, make-up or nail polish.  Do not wear lotions, powders, perfumes, or deodorant.  Do not shave 48 hours prior to surgery.  Do not bring valuables to the hospital.  [facility] is not responsible for any belongings or valuables.  Contacts, dentures or bridgework may not be worn into surgery.  Leave your suitcase in the car.  After surgery it may be brought to your room.  For patients admitted to the hospital, discharge time will be determined by your treatment team.  Please read over the following fact sheets that you were given. Pain Booklet and Surgical Site Infection Prevention       [place]- Preparing For Surgery  Before surgery, you can play an important role. Because skin is not sterile, your skin needs to be as free of germs as possible. You can reduce the number of germs on your skin by washing with CHG (chlorahexidine gluconate) Soap before surgery.  CHG is an antiseptic cleaner which kills germs and bonds with the skin to continue killing germs even after washing.    Oral Hygiene is also important to reduce your risk of infection.     Remember - BRUSH YOUR TEETH THE MORNING OF SURGERY WITH YOUR REGULAR TOOTHPASTE  Please do  not use if you have an allergy to CHG or antibacterial soaps. If your skin becomes reddened/irritated stop using the CHG.  Do not shave (including legs and underarms) for at least 48 hours prior to first CHG shower. It is OK to shave your face.  Please follow these instructions carefully.   1. Shower the NIGHT BEFORE SURGERY and the MORNING OF SURGERY with CHG.   2. If you chose to wash your hair, wash your hair first as usual with your normal shampoo.  3. After you shampoo, rinse your hair and body thoroughly to remove the shampoo.  4. Use CHG as you would any other liquid soap. You can apply CHG directly to the skin and wash gently with a scrungie or a clean washcloth.   5. Apply the CHG Soap to your body ONLY FROM THE NECK DOWN.  Do not use on open wounds or open sores. Avoid contact with your eyes, ears, mouth and genitals (private parts). Wash Face and genitals (private parts)  with your normal soap.  6. Wash thoroughly, paying special attention to the area where your surgery will be performed.  7. Thoroughly rinse your body with warm water from the neck down.  8. DO NOT shower/wash with your normal soap after using and rinsing off the CHG Soap.  9. Pat yourself dry with a CLEAN TOWEL.  10. Wear CLEAN PAJAMAS to bed the night before surgery, wear comfortable clothes the morning of surgery  11. Place CLEAN SHEETS on your bed the night of your first shower and DO NOT SLEEP WITH PETS.  Day of Surgery:  Do not apply any deodorants/lotions.  Please wear clean clothes to the hospital/surgery center.    Remember to brush your teeth WITH YOUR REGULAR TOOTHPASTE.

## 2018-01-25 NOTE — Progress Notes (Signed)
PCP  Jarrett Sohoourtney Wharton  PA-C

## 2018-02-01 NOTE — Anesthesia Preprocedure Evaluation (Addendum)
Anesthesia Evaluation  Patient identified by MRN, date of birth, ID band Patient awake    Reviewed: Allergy & Precautions, NPO status , Patient's Chart, lab work & pertinent test results  Airway Mallampati: II  TM Distance: >3 FB Neck ROM: Full    Dental  (+) Teeth Intact, Dental Advisory Given   Pulmonary neg pulmonary ROS,    breath sounds clear to auscultation       Cardiovascular negative cardio ROS   Rhythm:Regular Rate:Normal     Neuro/Psych  Headaches, Seizures -, Well Controlled,  Anxiety    GI/Hepatic negative GI ROS, Neg liver ROS,   Endo/Other  negative endocrine ROSMorbid obesity  Renal/GU negative Renal ROS     Musculoskeletal negative musculoskeletal ROS (+)   Abdominal   Peds  Hematology negative hematology ROS (+)   Anesthesia Other Findings ARTERIOVENOUS MALFORMATION  Reproductive/Obstetrics hcg negative                           Anesthesia Physical Anesthesia Plan  ASA: III  Anesthesia Plan: General   Post-op Pain Management:    Induction: Intravenous  PONV Risk Score and Plan: 3 and Dexamethasone, Ondansetron and Treatment may vary due to age or medical condition  Airway Management Planned: Oral ETT  Additional Equipment: Arterial line  Intra-op Plan:   Post-operative Plan: Possible Post-op intubation/ventilation and Extubation in OR  Informed Consent: I have reviewed the patients History and Physical, chart, labs and discussed the procedure including the risks, benefits and alternatives for the proposed anesthesia with the patient or authorized representative who has indicated his/her understanding and acceptance.   Dental advisory given  Plan Discussed with: CRNA and Anesthesiologist  Anesthesia Plan Comments:        Anesthesia Quick Evaluation

## 2018-02-01 NOTE — H&P (Signed)
Chief Complaint   Cerebellar AVM  HPI   HPI: [name] is a 42 y.o. 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  She underwent follow-up diagnostic cerebral angiogram in June 2019 for monitoring.   Unfortunately, the angiogram showed persistent AV shunting through branches of the right PICA as well as the left middle meningeal artery.  She presents today for surgical correction.  She is without any concerns today.  Patient Active Problem List   Diagnosis Date Noted  . 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 Anxiety state 06/28/2015  . Epilepsy (HCC) 06/28/2015  . Migraine 06/28/2015    PMH: Past Medical History:  Diagnosis Date  . Anemia   . Anxiety   . Migraine   . Seizures (HCC)    years ago    PSH: Past Surgical History:  Procedure Laterality Date  . BACK SURGERY     Lumbar  . IR ANGIO EXTERNAL CAROTID SEL EXT CAROTID BILAT MOD SED  03/06/2017  . IR ANGIO INTRA EXTRACRAN SEL COM CAROTID INNOMINATE BILAT MOD SED  11/24/2017  . IR ANGIO INTRA EXTRACRAN SEL INTERNAL CAROTID BILAT MOD SED  03/06/2017  . IR ANGIO VERTEBRAL SEL VERTEBRAL BILAT MOD SED  03/06/2017  . IR ANGIO VERTEBRAL SEL VERTEBRAL BILAT MOD SED  11/24/2017  . IR ANGIO VERTEBRAL SEL VERTEBRAL UNI L MOD SED  04/28/2017  . IR NEURO EACH ADD'L AFTER BASIC UNI LEFT (MS)  04/28/2017  . IR TRANSCATH/EMBOLIZ  04/28/2017  . RADIOLOGY WITH ANESTHESIA N/A 04/28/2017   Procedure: Arteriogram, Onyx embolization of AVM;  Surgeon: Lisbeth RenshawNundkumar, Neelesh, MD;  [facility];  Service: Radiology;  Laterality: N/A;  . TONSILLECTOMY    . WISDOM TOOTH EXTRACTION      No medications prior to admission.    SH: Social History   Tobacco Use  . Smoking status: Never Smoker  .  Smokeless tobacco: Never Used  Substance Use Topics  . Alcohol use: No    Alcohol/week: 0.0 standard drinks  . Drug use: No    MEDS: Prior to Admission medications   Medication Sig Start Date End Date Taking? Authorizing Provider  ibuprofen (ADVIL,MOTRIN) 800 MG tablet Take 800 mg by mouth daily as needed for headache.    Yes [provider]  lamoTRIgine (LAMICTAL) 100 MG tablet 150 mg every morning and 200 every night 08/11/17  Yes Nilda RiggsMartin, Nancy Carolyn, NP  levonorgestrel (MIRENA) 20 MCG/24HR IUD 1 each by Intrauterine route once.   Yes [provider]  metroNIDAZOLE (METROCREAM) 0.75 % cream Apply 1 application topically 3 (three) times a week.   Yes [provider]  rizatriptan (MAXALT) 10 MG tablet TAKE 1 TABLET FOR MIGRAINE. MAY REPEAT IN 2 HOURS. NO MORE THAN 2/24 HOURS OR 2/3 DAYS 01/13/18  Yes York SpanielWillis, Charles K, MD  topiramate (TOPAMAX) 100 MG tablet Take 1 tablet (100 mg total) by mouth 2 (two) times daily. Patient taking differently: Take 50 mg by mouth 2 (two) times daily.  01/13/18  Yes York SpanielWillis, Charles K, MD  dexamethasone (DECADRON) 2 MG tablet Take 3 tablets the first day, 2 the second and 1 the third day Patient not taking: Reported on 01/19/2018 01/13/18   York SpanielWillis, Charles K, MD    ALLERGY: No Known Allergies  Social History   Tobacco  Use  . Smoking status: Never Smoker  . Smokeless tobacco: Never Used  Substance Use Topics  . Alcohol use: No    Alcohol/week: 0.0 standard drinks     Family History  Problem Relation Age of Onset  . Crohn's disease Sister   . Diabetes Brother   . Other Brother        hypoglycemic  . Thyroid disease Neg Hx      ROS   ROS  Exam   There were no vitals filed for this visit. General appearance: WDWN, NAD Eyes: PERRL, Fundoscopic: normal Cardiovascular: Regular rate and rhythm without murmurs, rubs, gallops. No edema or variciosities. Distal pulses normal. Pulmonary: Clear to  auscultation Musculoskeletal:     Muscle tone upper extremities: Normal    Muscle tone lower extremities: Normal    Motor exam: Upper Extremities Deltoid Bicep Tricep Grip  Right 5/5 5/5 5/5 5/5  Left 5/5 5/5 5/5 5/5   Lower Extremity IP Quad PF DF EHL  Right 5/5 5/5 5/5 5/5 5/5  Left 5/5 5/5 5/5 5/5 5/5   Neurological Awake, alert, oriented Memory and concentration grossly intact Speech fluent, appropriate CNII: Visual fields normal CNIII/IV/VI: EOMI CNV: Facial sensation normal CNVII: Symmetric, normal strength CNVIII: Grossly normal CNIX: Normal palate movement CNXI: Trap and SCM strength normal CN XII: Tongue protrusion normal Sensation grossly intact to LT DTR: Normal Coordination (finger/nose & heel/shin): Normal  Results - Imaging/Labs   No results found for this or any previous visit (from the past 48 hour(s)).  No results found.  Diagnostic cerebral angiogram dated 11/24/2017 was again reviewed.  This demonstrates persistent arteriovenous shunting through branches of the right PICA, as well as the left middle meningeal artery.  Impression/Plan   42 y.o. 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  Regardless, because of history of hemorrhage, it was recommended that she undergo suboccipital craniotomy for disconnection/ resection of the cerebellar AV malformation.    While in the office, risks, benefits and alternatives to surgery were discussed.  Patient states in own language understanding procedure and wishes to proceed.  Consent signed.

## 2018-02-02 ENCOUNTER — Ambulatory Visit (HOSPITAL_COMMUNITY)
Admission: RE | Admit: 2018-02-02 | Discharge: 2018-02-02 | Disposition: A | Discharge status: Home / Self Care | Payer: 59 | Source: Ambulatory Visit | Attending: Neurosurgery | Admitting: Neurosurgery

## 2018-02-08 ENCOUNTER — Ambulatory Visit: Payer: 59 | Admitting: Nurse Practitioner

## 2018-02-18 ENCOUNTER — Other Ambulatory Visit: Payer: Self-pay

## 2018-02-18 ENCOUNTER — Encounter (HOSPITAL_COMMUNITY): Payer: Self-pay | Admitting: *Deleted

## 2018-02-18 NOTE — H&P (Signed)
Chief Complaint   Cerebellar AVM  HPI   HPI: Joanne Long is a 42 y.o. 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  She underwent follow-up diagnostic cerebral angiogram in June 2019 for monitoring.   Unfortunately, the angiogram showed persistent AV shunting through branches of the right PICA as well as the left middle meningeal artery.  She presents today for surgical correction.  She is without any concerns today.  Patient Active Problem List   Diagnosis Date Noted  . 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 Anxiety state 06/28/2015  . Epilepsy (HCC) 06/28/2015  . Migraine 06/28/2015    PMH: Past Medical History:  Diagnosis Date  . Anemia   . Anxiety   . Migraine   . Seizures (HCC)    years ago    PSH: Past Surgical History:  Procedure Laterality Date  . BACK SURGERY     Lumbar  . IR ANGIO EXTERNAL CAROTID SEL EXT CAROTID BILAT MOD SED  03/06/2017  . IR ANGIO INTRA EXTRACRAN SEL COM CAROTID INNOMINATE BILAT MOD SED  11/24/2017  . IR ANGIO INTRA EXTRACRAN SEL INTERNAL CAROTID BILAT MOD SED  03/06/2017  . IR ANGIO VERTEBRAL SEL VERTEBRAL BILAT MOD SED  03/06/2017  . IR ANGIO VERTEBRAL SEL VERTEBRAL BILAT MOD SED  11/24/2017  . IR ANGIO VERTEBRAL SEL VERTEBRAL UNI L MOD SED  04/28/2017  . IR NEURO EACH ADD'L AFTER BASIC UNI LEFT (MS)  04/28/2017  . IR TRANSCATH/EMBOLIZ  04/28/2017  . RADIOLOGY WITH ANESTHESIA N/A 04/28/2017   Procedure: Arteriogram, Onyx embolization of AVM;  Surgeon: Lisbeth Renshaw, MD;  Location: [facility] OR;  Service: Radiology;  Laterality: N/A;  . TONSILLECTOMY    . WISDOM TOOTH EXTRACTION      No medications prior to admission.    SH: Social History   Tobacco Use  . Smoking status: Never Smoker  .  Smokeless tobacco: Never Used  Substance Use Topics  . Alcohol use: No    Alcohol/week: 0.0 standard drinks  . Drug use: No    MEDS: Prior to Admission medications   Medication Sig Start Date End Date Taking? Authorizing Provider  ibuprofen (ADVIL,MOTRIN) 800 MG tablet Take 800 mg by mouth daily as needed for headache.    Yes [provider]  lamoTRIgine (LAMICTAL) 100 MG tablet 150 mg every morning and 200 every night 08/11/17  Yes Nilda Riggs, NP  levonorgestrel (MIRENA) 20 MCG/24HR IUD 1 each by Intrauterine route once.   Yes [provider]  metroNIDAZOLE (METROCREAM) 0.75 % cream Apply 1 application topically 3 (three) times a week.   Yes [provider]  rizatriptan (MAXALT) 10 MG tablet TAKE 1 TABLET FOR MIGRAINE. MAY REPEAT IN 2 HOURS. NO MORE THAN 2/24 HOURS OR 2/3 DAYS 01/13/18  Yes York Spaniel, MD  topiramate (TOPAMAX) 100 MG tablet Take 1 tablet (100 mg total) by mouth 2 (two) times daily. Patient taking differently: Take 50 mg by mouth 2 (two) times daily.  01/13/18  Yes York Spaniel, MD  dexamethasone (DECADRON) 2 MG tablet Take 3 tablets the first day, 2 the second and 1 the third day Patient not taking: Reported on 01/19/2018 01/13/18   York Spaniel, MD    ALLERGY: No Known Allergies  Social History   Tobacco  Use  . Smoking status: Never Smoker  . Smokeless tobacco: Never Used  Substance Use Topics  . Alcohol use: No    Alcohol/week: 0.0 standard drinks     Family History  Problem Relation Age of Onset  . Crohn's disease Sister   . Diabetes Brother   . Other Brother        hypoglycemic  . Thyroid disease Neg Hx      ROS   ROS  Exam   There were no vitals filed for this visit. General appearance: WDWN, NAD Eyes: PERRL, Fundoscopic: normal Cardiovascular: Regular rate and rhythm without murmurs, rubs, gallops. No edema or variciosities. Distal pulses normal. Pulmonary: Clear to  auscultation Musculoskeletal:     Muscle tone upper extremities: Normal    Muscle tone lower extremities: Normal    Motor exam: Upper Extremities Deltoid Bicep Tricep Grip  Right 5/5 5/5 5/5 5/5  Left 5/5 5/5 5/5 5/5   Lower Extremity IP Quad PF DF EHL  Right 5/5 5/5 5/5 5/5 5/5  Left 5/5 5/5 5/5 5/5 5/5   Neurological Awake, alert, oriented Memory and concentration grossly intact Speech fluent, appropriate CNII: Visual fields normal CNIII/IV/VI: EOMI CNV: Facial sensation normal CNVII: Symmetric, normal strength CNVIII: Grossly normal CNIX: Normal palate movement CNXI: Trap and SCM strength normal CN XII: Tongue protrusion normal Sensation grossly intact to LT DTR: Normal Coordination (finger/nose & heel/shin): Normal  Results - Imaging/Labs   No results found for this or any previous visit (from the past 48 hour(s)).  No results found.  Diagnostic cerebral angiogram dated 11/24/2017 was again reviewed.  This demonstrates persistent arteriovenous shunting through branches of the right PICA, as well as the left middle meningeal artery.  Impression/Plan   42 y.o. 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  Regardless, because of history of hemorrhage, it was recommended that she undergo suboccipital craniotomy for disconnection/ resection of the cerebellar AV malformation.    While in the office, risks, benefits and alternatives to surgery were discussed.  Patient states in own language understanding procedure and wishes to proceed.  Consent signed.      

## 2018-02-18 NOTE — Progress Notes (Signed)
Pt stated " everything is exactly the same" since her recent PAT appointment. Pt denies SOB, chest pain, and being under the care of a cardiologist. Pt denies having a stress test, echo and cardiac cath. Pt denies having an EKG and chest x ray within the last year. Pt stated that she has instructions from previous PAT appointment. Pt verbalized understanding of all pre-op instructions.

## 2018-02-19 ENCOUNTER — Inpatient Hospital Stay (HOSPITAL_COMMUNITY): Payer: 59

## 2018-02-19 ENCOUNTER — Encounter (HOSPITAL_COMMUNITY)
Admission: RE | Disposition: A | Discharge status: Home / Self Care | Payer: Self-pay | Source: Ambulatory Visit | Attending: Neurosurgery

## 2018-02-19 ENCOUNTER — Inpatient Hospital Stay (HOSPITAL_COMMUNITY)
Admission: RE | Admit: 2018-02-19 | Discharge: 2018-02-24 | DRG: 026 | Disposition: A | Discharge status: Home / Self Care | Payer: 59 | Source: Ambulatory Visit | Attending: Neurosurgery | Admitting: Neurosurgery

## 2018-02-19 ENCOUNTER — Encounter (HOSPITAL_COMMUNITY): Payer: Self-pay | Admitting: Anesthesiology

## 2018-02-19 DIAGNOSIS — R51 Headache: Secondary | ICD-10-CM | POA: Diagnosis not present

## 2018-02-19 DIAGNOSIS — Q282 Arteriovenous malformation of cerebral vessels: Secondary | ICD-10-CM | POA: Diagnosis present

## 2018-02-19 DIAGNOSIS — G40909 Epilepsy, unspecified, not intractable, without status epilepticus: Secondary | ICD-10-CM | POA: Diagnosis present

## 2018-02-19 DIAGNOSIS — Z79899 Other long term (current) drug therapy: Secondary | ICD-10-CM

## 2018-02-19 DIAGNOSIS — M542 Cervicalgia: Secondary | ICD-10-CM | POA: Diagnosis not present

## 2018-02-19 DIAGNOSIS — F411 Generalized anxiety disorder: Secondary | ICD-10-CM | POA: Diagnosis present

## 2018-02-19 DIAGNOSIS — F419 Anxiety disorder, unspecified: Secondary | ICD-10-CM | POA: Diagnosis present

## 2018-02-19 DIAGNOSIS — Z6841 Body Mass Index (BMI) 40.0 and over, adult: Secondary | ICD-10-CM | POA: Diagnosis not present

## 2018-02-19 DIAGNOSIS — I729 Aneurysm of unspecified site: Secondary | ICD-10-CM

## 2018-02-19 HISTORY — PX: CRANIOTOMY: SHX93

## 2018-02-19 LAB — CBC
HEMATOCRIT: 37.8 % (ref 36.0–46.0)
HEMOGLOBIN: 12.2 g/dL (ref 12.0–15.0)
MCH: 26.5 pg (ref 26.0–34.0)
MCHC: 32.3 g/dL (ref 30.0–36.0)
MCV: 82 fL (ref 78.0–100.0)
Platelets: 244 10*3/uL (ref 150–400)
RBC: 4.61 MIL/uL (ref 3.87–5.11)
RDW: 14 % (ref 11.5–15.5)
WBC: 9.2 10*3/uL (ref 4.0–10.5)

## 2018-02-19 LAB — BASIC METABOLIC PANEL
ANION GAP: 7 (ref 5–15)
BUN: 13 mg/dL (ref 6–20)
CHLORIDE: 114 mmol/L — AB (ref 98–111)
CO2: 18 mmol/L — AB (ref 22–32)
CREATININE: 0.84 mg/dL (ref 0.44–1.00)
Calcium: 8.2 mg/dL — ABNORMAL LOW (ref 8.9–10.3)
GFR calc non Af Amer: >60 mL/min (ref >60)
Glucose, Bld: 106 mg/dL — ABNORMAL HIGH (ref 70–99)
POTASSIUM: 4.1 mmol/L (ref 3.5–5.1)
SODIUM: 139 mmol/L (ref 135–145)

## 2018-02-19 LAB — POCT PREGNANCY, URINE: Preg Test, Ur: NEGATIVE

## 2018-02-19 LAB — MRSA PCR SCREENING: MRSA by PCR: NEGATIVE

## 2018-02-19 LAB — PREPARE RBC (CROSSMATCH)

## 2018-02-19 SURGERY — CRANIOTOMY INTRACRANIAL ARTERIO-VENOUS MALFORMATION DURAL COMPLEX (AVM)
Anesthesia: General | Site: Head

## 2018-02-19 MED ORDER — SODIUM CHLORIDE 0.9 % IV SOLN
INTRAVENOUS | Status: DC | PRN
  Administered 2018-02-19: 11:00:00 via INTRAVENOUS

## 2018-02-19 MED ORDER — THROMBIN (RECOMBINANT) 5000 UNITS EX SOLR
CUTANEOUS | Status: AC
  Filled 2018-02-19: qty 5000

## 2018-02-19 MED ORDER — DEXAMETHASONE SODIUM PHOSPHATE 4 MG/ML IJ SOLN
4.0000 mg | Freq: Four times a day (QID) | INTRAMUSCULAR | Status: AC
  Administered 2018-02-20 – 2018-02-21 (×4): 4 mg via INTRAVENOUS
  Filled 2018-02-19 (×4): qty 1

## 2018-02-19 MED ORDER — ONDANSETRON HCL 4 MG/2ML IJ SOLN: 4.0000 mg | Freq: Once | INTRAMUSCULAR | Status: DC | PRN

## 2018-02-19 MED ORDER — PROPOFOL 10 MG/ML IV BOLUS
INTRAVENOUS | Status: DC | PRN
  Administered 2018-02-19 (×2): 50 mg via INTRAVENOUS
  Administered 2018-02-19: 200 mg via INTRAVENOUS

## 2018-02-19 MED ORDER — MICROFIBRILLAR COLL HEMOSTAT EX POWD
CUTANEOUS | Status: AC
  Filled 2018-02-19: qty 5

## 2018-02-19 MED ORDER — SODIUM CHLORIDE 0.9 % IV SOLN
0.0500 ug/kg/min | INTRAVENOUS | Status: DC
  Administered 2018-02-19: 0.1 ug/kg/min via INTRAVENOUS
  Administered 2018-02-19: 12:00:00 via INTRAVENOUS
  Filled 2018-02-19 (×3): qty 5000

## 2018-02-19 MED ORDER — DEXTROSE 5 % IV SOLN
3.0000 g | INTRAVENOUS | Status: DC
  Filled 2018-02-19: qty 3000

## 2018-02-19 MED ORDER — LEVONORGESTREL 20 MCG/24HR IU IUD: 1.0000 | INTRAUTERINE_SYSTEM | Freq: Once | INTRAUTERINE | Status: DC

## 2018-02-19 MED ORDER — PROPOFOL 10 MG/ML IV BOLUS
INTRAVENOUS | Status: AC
  Filled 2018-02-19: qty 20

## 2018-02-19 MED ORDER — 0.9 % SODIUM CHLORIDE (POUR BTL) OPTIME
TOPICAL | Status: DC | PRN
  Administered 2018-02-19: 1000 mL

## 2018-02-19 MED ORDER — CEFAZOLIN SODIUM 1 G IJ SOLR
INTRAMUSCULAR | Status: AC
  Filled 2018-02-19: qty 10

## 2018-02-19 MED ORDER — ESMOLOL HCL 100 MG/10ML IV SOLN
INTRAVENOUS | Status: DC | PRN
  Administered 2018-02-19: 30 mg via INTRAVENOUS

## 2018-02-19 MED ORDER — SODIUM CHLORIDE 0.9% IV SOLUTION: Freq: Once | INTRAVENOUS | Status: DC

## 2018-02-19 MED ORDER — ROCURONIUM BROMIDE 50 MG/5ML IV SOSY
PREFILLED_SYRINGE | INTRAVENOUS | Status: AC
  Filled 2018-02-19: qty 20

## 2018-02-19 MED ORDER — SENNOSIDES-DOCUSATE SODIUM 8.6-50 MG PO TABS
1.0000 | ORAL_TABLET | Freq: Every evening | ORAL | Status: DC | PRN
  Administered 2018-02-23: 1 via ORAL
  Filled 2018-02-19: qty 1

## 2018-02-19 MED ORDER — DEXAMETHASONE SODIUM PHOSPHATE 4 MG/ML IJ SOLN
4.0000 mg | Freq: Three times a day (TID) | INTRAMUSCULAR | Status: DC
  Administered 2018-02-21 – 2018-02-24 (×9): 4 mg via INTRAVENOUS
  Filled 2018-02-19 (×9): qty 1

## 2018-02-19 MED ORDER — MIDAZOLAM HCL 5 MG/5ML IJ SOLN
INTRAMUSCULAR | Status: DC | PRN
  Administered 2018-02-19: 1 mg via INTRAVENOUS

## 2018-02-19 MED ORDER — SUGAMMADEX SODIUM 500 MG/5ML IV SOLN
INTRAVENOUS | Status: AC
  Filled 2018-02-19: qty 5

## 2018-02-19 MED ORDER — BUPIVACAINE HCL 0.5 % IJ SOLN
INTRAMUSCULAR | Status: DC | PRN
  Administered 2018-02-19: 10 mL

## 2018-02-19 MED ORDER — BUPIVACAINE HCL (PF) 0.5 % IJ SOLN
INTRAMUSCULAR | Status: AC
  Filled 2018-02-19: qty 30

## 2018-02-19 MED ORDER — DEXAMETHASONE SODIUM PHOSPHATE 10 MG/ML IJ SOLN
INTRAMUSCULAR | Status: DC | PRN
  Administered 2018-02-19: 10 mg via INTRAVENOUS

## 2018-02-19 MED ORDER — ONDANSETRON HCL 4 MG/2ML IJ SOLN
INTRAMUSCULAR | Status: AC
  Filled 2018-02-19: qty 2

## 2018-02-19 MED ORDER — SUGAMMADEX SODIUM 200 MG/2ML IV SOLN
INTRAVENOUS | Status: DC | PRN
  Administered 2018-02-19: 450 mg via INTRAVENOUS

## 2018-02-19 MED ORDER — BACITRACIN ZINC 500 UNIT/GM EX OINT
TOPICAL_OINTMENT | CUTANEOUS | Status: DC | PRN
  Administered 2018-02-19: 1 via TOPICAL

## 2018-02-19 MED ORDER — ACETAMINOPHEN 650 MG RE SUPP: 650.0000 mg | RECTAL | Status: DC | PRN

## 2018-02-19 MED ORDER — NICARDIPINE HCL IN NACL 20-0.86 MG/200ML-% IV SOLN
3.0000 mg/h | INTRAVENOUS | Status: DC
  Administered 2018-02-19: 3 mg/h via INTRAVENOUS
  Administered 2018-02-19: 12.5 mg/h via INTRAVENOUS
  Filled 2018-02-19 (×2): qty 200

## 2018-02-19 MED ORDER — MIDAZOLAM HCL 2 MG/2ML IJ SOLN
INTRAMUSCULAR | Status: AC
  Filled 2018-02-19: qty 2

## 2018-02-19 MED ORDER — SODIUM CHLORIDE 0.9 % IV SOLN
INTRAVENOUS | Status: DC
  Administered 2018-02-20: 18:00:00 via INTRAVENOUS

## 2018-02-19 MED ORDER — LIDOCAINE HCL (PF) 1 % IJ SOLN
INTRAMUSCULAR | Status: AC
  Filled 2018-02-19: qty 30

## 2018-02-19 MED ORDER — MORPHINE SULFATE (PF) 2 MG/ML IV SOLN
1.0000 mg | INTRAVENOUS | Status: DC | PRN
  Administered 2018-02-19 – 2018-02-23 (×13): 2 mg via INTRAVENOUS
  Filled 2018-02-19 (×14): qty 1

## 2018-02-19 MED ORDER — ACETAMINOPHEN 325 MG PO TABS
650.0000 mg | ORAL_TABLET | ORAL | Status: DC | PRN
  Administered 2018-02-19: 650 mg via ORAL
  Filled 2018-02-19: qty 2

## 2018-02-19 MED ORDER — BACITRACIN ZINC 500 UNIT/GM EX OINT
TOPICAL_OINTMENT | CUTANEOUS | Status: AC
  Filled 2018-02-19: qty 28.35

## 2018-02-19 MED ORDER — FENTANYL CITRATE (PF) 100 MCG/2ML IJ SOLN
INTRAMUSCULAR | Status: DC | PRN
  Administered 2018-02-19: 50 ug via INTRAVENOUS
  Administered 2018-02-19: 100 ug via INTRAVENOUS

## 2018-02-19 MED ORDER — LAMOTRIGINE 150 MG PO TABS
150.0000 mg | ORAL_TABLET | Freq: Every morning | ORAL | Status: DC
  Administered 2018-02-20 – 2018-02-24 (×5): 150 mg via ORAL
  Filled 2018-02-19 (×2): qty 1
  Filled 2018-02-19 (×2): qty 2
  Filled 2018-02-19: qty 1

## 2018-02-19 MED ORDER — DEXAMETHASONE SODIUM PHOSPHATE 10 MG/ML IJ SOLN
6.0000 mg | Freq: Four times a day (QID) | INTRAMUSCULAR | Status: AC
  Administered 2018-02-19 – 2018-02-20 (×4): 6 mg via INTRAVENOUS
  Filled 2018-02-19 (×4): qty 1

## 2018-02-19 MED ORDER — SODIUM CHLORIDE 0.9 % IV SOLN
INTRAVENOUS | Status: DC | PRN
  Administered 2018-02-19: 500 mL

## 2018-02-19 MED ORDER — ONDANSETRON HCL 4 MG PO TABS: 4.0000 mg | ORAL_TABLET | ORAL | Status: DC | PRN

## 2018-02-19 MED ORDER — HEPARIN SODIUM (PORCINE) 5000 UNIT/ML IJ SOLN
5000.0000 [IU] | Freq: Three times a day (TID) | INTRAMUSCULAR | Status: DC
  Administered 2018-02-21 – 2018-02-24 (×10): 5000 [IU] via SUBCUTANEOUS
  Filled 2018-02-19 (×10): qty 1

## 2018-02-19 MED ORDER — CHLORHEXIDINE GLUCONATE CLOTH 2 % EX PADS: 6.0000 | MEDICATED_PAD | Freq: Once | CUTANEOUS | Status: DC

## 2018-02-19 MED ORDER — SODIUM CHLORIDE 0.9 % IV SOLN
INTRAVENOUS | Status: DC | PRN
  Administered 2018-02-19 (×3): via INTRAVENOUS

## 2018-02-19 MED ORDER — DEXAMETHASONE SODIUM PHOSPHATE 10 MG/ML IJ SOLN
INTRAMUSCULAR | Status: AC
  Filled 2018-02-19: qty 1

## 2018-02-19 MED ORDER — FENTANYL CITRATE (PF) 100 MCG/2ML IJ SOLN
INTRAMUSCULAR | Status: AC
  Filled 2018-02-19: qty 2

## 2018-02-19 MED ORDER — ESMOLOL HCL 100 MG/10ML IV SOLN
INTRAVENOUS | Status: AC
  Filled 2018-02-19: qty 10

## 2018-02-19 MED ORDER — LAMOTRIGINE 100 MG PO TABS
200.0000 mg | ORAL_TABLET | Freq: Every evening | ORAL | Status: DC
  Administered 2018-02-19 – 2018-02-23 (×5): 200 mg via ORAL
  Filled 2018-02-19 (×6): qty 2

## 2018-02-19 MED ORDER — ONDANSETRON HCL 4 MG/2ML IJ SOLN
INTRAMUSCULAR | Status: DC | PRN
  Administered 2018-02-19: 4 mg via INTRAVENOUS

## 2018-02-19 MED ORDER — LIDOCAINE HCL (PF) 1 % IJ SOLN
INTRAMUSCULAR | Status: DC | PRN
  Administered 2018-02-19: 10 mL

## 2018-02-19 MED ORDER — SUMATRIPTAN SUCCINATE 50 MG PO TABS
50.0000 mg | ORAL_TABLET | ORAL | Status: DC | PRN
  Administered 2018-02-19 (×2): 50 mg via ORAL
  Filled 2018-02-19 (×4): qty 1

## 2018-02-19 MED ORDER — FENTANYL CITRATE (PF) 250 MCG/5ML IJ SOLN
INTRAMUSCULAR | Status: AC
  Filled 2018-02-19: qty 5

## 2018-02-19 MED ORDER — SODIUM CHLORIDE 0.9 % IV SOLN
INTRAVENOUS | Status: DC | PRN
  Administered 2018-02-19: 07:00:00 via INTRAVENOUS

## 2018-02-19 MED ORDER — ONDANSETRON HCL 4 MG/2ML IJ SOLN: 4.0000 mg | INTRAMUSCULAR | Status: DC | PRN

## 2018-02-19 MED ORDER — BISACODYL 10 MG RE SUPP: 10.0000 mg | Freq: Every day | RECTAL | Status: DC | PRN

## 2018-02-19 MED ORDER — SODIUM BICARBONATE 8.4 % IV SOLN
INTRAVENOUS | Status: DC | PRN
  Administered 2018-02-19 (×2): 50 mL via INTRAVENOUS

## 2018-02-19 MED ORDER — THROMBIN 5000 UNITS EX SOLR
OROMUCOSAL | Status: DC | PRN
  Administered 2018-02-19: 5 mL via TOPICAL

## 2018-02-19 MED ORDER — ROCURONIUM BROMIDE 50 MG/5ML IV SOSY
PREFILLED_SYRINGE | INTRAVENOUS | Status: AC
  Filled 2018-02-19: qty 5

## 2018-02-19 MED ORDER — FENTANYL CITRATE (PF) 100 MCG/2ML IJ SOLN
25.0000 ug | INTRAMUSCULAR | Status: DC | PRN
  Administered 2018-02-19: 50 ug via INTRAVENOUS

## 2018-02-19 MED ORDER — SODIUM CHLORIDE 0.9 % IV SOLN
INTRAVENOUS | Status: DC | PRN
  Administered 2018-02-19: 25 ug/min via INTRAVENOUS

## 2018-02-19 MED ORDER — PANTOPRAZOLE SODIUM 20 MG PO TBEC
20.0000 mg | DELAYED_RELEASE_TABLET | Freq: Every day | ORAL | Status: DC
  Administered 2018-02-19 – 2018-02-24 (×6): 20 mg via ORAL
  Filled 2018-02-19 (×6): qty 1

## 2018-02-19 MED ORDER — TOPIRAMATE 25 MG PO TABS
50.0000 mg | ORAL_TABLET | Freq: Two times a day (BID) | ORAL | Status: DC
  Administered 2018-02-19 – 2018-02-24 (×10): 50 mg via ORAL
  Filled 2018-02-19 (×10): qty 2

## 2018-02-19 MED ORDER — PROMETHAZINE HCL 25 MG PO TABS: 12.5000 mg | ORAL_TABLET | ORAL | Status: DC | PRN

## 2018-02-19 MED ORDER — HEMOSTATIC AGENTS (NO CHARGE) OPTIME
TOPICAL | Status: DC | PRN
  Administered 2018-02-19: 1 via TOPICAL

## 2018-02-19 MED ORDER — THROMBIN 20000 UNITS EX KIT
PACK | CUTANEOUS | Status: AC
  Filled 2018-02-19: qty 1

## 2018-02-19 MED ORDER — CEFAZOLIN SODIUM-DEXTROSE 2-4 GM/100ML-% IV SOLN
2.0000 g | INTRAVENOUS | Status: AC
  Administered 2018-02-19 (×2): 3 g via INTRAVENOUS
  Filled 2018-02-19: qty 100

## 2018-02-19 MED ORDER — THROMBIN 20000 UNITS EX SOLR
CUTANEOUS | Status: DC | PRN
  Administered 2018-02-19: 20 mL via TOPICAL

## 2018-02-19 MED ORDER — HYDROCODONE-ACETAMINOPHEN 5-325 MG PO TABS
1.0000 | ORAL_TABLET | ORAL | Status: DC | PRN
  Administered 2018-02-19 – 2018-02-22 (×9): 1 via ORAL
  Filled 2018-02-19 (×9): qty 1

## 2018-02-19 MED ORDER — ROCURONIUM BROMIDE 10 MG/ML (PF) SYRINGE
PREFILLED_SYRINGE | INTRAVENOUS | Status: DC | PRN
  Administered 2018-02-19: 20 mg via INTRAVENOUS
  Administered 2018-02-19: 50 mg via INTRAVENOUS
  Administered 2018-02-19: 30 mg via INTRAVENOUS
  Administered 2018-02-19: 50 mg via INTRAVENOUS
  Administered 2018-02-19: 10 mg via INTRAVENOUS
  Administered 2018-02-19: 20 mg via INTRAVENOUS
  Administered 2018-02-19: 30 mg via INTRAVENOUS

## 2018-02-19 SURGICAL SUPPLY — 98 items
ADH SKN CLS APL DERMABOND .7 (GAUZE/BANDAGES/DRESSINGS) ×1
APL SKNCLS STERI-STRIP NONHPOA (GAUZE/BANDAGES/DRESSINGS)
BATTERY IQ STERILE (MISCELLANEOUS) ×2 IMPLANT
BENZOIN TINCTURE PRP APPL 2/3 (GAUZE/BANDAGES/DRESSINGS) IMPLANT
BIT DRILL WIRE PASS 1.3MM (BIT) IMPLANT
BLADE SAW GIGLI 16 STRL (MISCELLANEOUS) IMPLANT
BLADE SURG 15 STRL LF DISP TIS (BLADE) ×1 IMPLANT
BLADE SURG 15 STRL SS (BLADE) ×3
BLADE ULTRA TIP 2M (BLADE) IMPLANT
BNDG CMPR 75X41 PLY HI ABS (GAUZE/BANDAGES/DRESSINGS)
BNDG GAUZE ELAST 4 BULKY (GAUZE/BANDAGES/DRESSINGS) IMPLANT
BNDG STRETCH 4X75 STRL LF (GAUZE/BANDAGES/DRESSINGS) IMPLANT
BTRY SRG DRVR 1.5 IQ (MISCELLANEOUS) ×1
BUR ACORN 6.0 PRECISION (BURR) ×2 IMPLANT
BUR ACORN 6.0MM PRECISION (BURR) ×1
BUR MATCHSTICK NEURO 3.0 LAGG (BURR) IMPLANT
BUR ROUND FLUTED 4 SOFT TCH (BURR) ×2 IMPLANT
BUR ROUND FLUTED 4MM SOFT TCH (BURR) ×2
BUR SPIRAL ROUTER 2.3 (BUR) IMPLANT
BUR SPIRAL ROUTER 2.3MM (BUR)
CABLE BIPOLOR RESECTION CORD (MISCELLANEOUS) ×3 IMPLANT
CANISTER SUCT 3000ML PPV (MISCELLANEOUS) ×3 IMPLANT
CARTRIDGE OIL MAESTRO DRILL (MISCELLANEOUS) ×1 IMPLANT
CLIP VESOCCLUDE MED 6/CT (CLIP) ×2 IMPLANT
DECANTER SPIKE VIAL GLASS SM (MISCELLANEOUS) ×3 IMPLANT
DERMABOND ADVANCED (GAUZE/BANDAGES/DRESSINGS) ×2
DERMABOND ADVANCED .7 DNX12 (GAUZE/BANDAGES/DRESSINGS) IMPLANT
DIFFUSER DRILL AIR PNEUMATIC (MISCELLANEOUS) ×3 IMPLANT
DRAPE MICROSCOPE LEICA (MISCELLANEOUS) ×3 IMPLANT
DRAPE NEUROLOGICAL W/INCISE (DRAPES) ×3 IMPLANT
DRAPE WARM FLUID 44X44 (DRAPE) ×3 IMPLANT
DRILL WIRE PASS 1.3MM (BIT)
DRSG ADAPTIC 3X8 NADH LF (GAUZE/BANDAGES/DRESSINGS) ×3 IMPLANT
DRSG OPSITE POSTOP 4X6 (GAUZE/BANDAGES/DRESSINGS) ×2 IMPLANT
DRSG TELFA 3X8 NADH (GAUZE/BANDAGES/DRESSINGS) ×3 IMPLANT
DURAMATRIX ONLAY 2X2 (Neuro Prosthesis/Implant) ×2 IMPLANT
DURAPREP 6ML APPLICATOR 50/CS (WOUND CARE) ×3 IMPLANT
ELECT REM PT RETURN 9FT ADLT (ELECTROSURGICAL) ×3
ELECTRODE REM PT RTRN 9FT ADLT (ELECTROSURGICAL) ×1 IMPLANT
EVACUATOR SILICONE 100CC (DRAIN) IMPLANT
FORCEPS BIPOLAR SPETZLER 8 1.0 (NEUROSURGERY SUPPLIES) ×2 IMPLANT
GAUZE 4X4 16PLY RFD (DISPOSABLE) IMPLANT
GAUZE SPONGE 4X4 12PLY STRL (GAUZE/BANDAGES/DRESSINGS) IMPLANT
GLOVE BIO SURGEON STRL SZ7.5 (GLOVE) IMPLANT
GLOVE BIOGEL PI IND STRL 7.5 (GLOVE) ×2 IMPLANT
GLOVE BIOGEL PI INDICATOR 7.5 (GLOVE) ×4
GLOVE ECLIPSE 7.0 STRL STRAW (GLOVE) ×6 IMPLANT
GLOVE ECLIPSE 9.0 STRL (GLOVE) ×2 IMPLANT
GLOVE EXAM NITRILE LRG STRL (GLOVE) IMPLANT
GLOVE EXAM NITRILE XL STR (GLOVE) IMPLANT
GLOVE EXAM NITRILE XS STR PU (GLOVE) IMPLANT
GOWN STRL REUS W/ TWL LRG LVL3 (GOWN DISPOSABLE) ×2 IMPLANT
GOWN STRL REUS W/ TWL XL LVL3 (GOWN DISPOSABLE) IMPLANT
GOWN STRL REUS W/TWL 2XL LVL3 (GOWN DISPOSABLE) IMPLANT
GOWN STRL REUS W/TWL LRG LVL3 (GOWN DISPOSABLE) ×6
GOWN STRL REUS W/TWL XL LVL3 (GOWN DISPOSABLE) ×3
HEMOSTAT POWDER KIT SURGIFOAM (HEMOSTASIS) ×3 IMPLANT
HEMOSTAT SURGICEL 2X14 (HEMOSTASIS) ×3 IMPLANT
HOOK DURA (MISCELLANEOUS) ×3 IMPLANT
KIT BASIN OR (CUSTOM PROCEDURE TRAY) ×3 IMPLANT
KIT DRAIN CSF ACCUDRAIN (MISCELLANEOUS) IMPLANT
KIT TURNOVER KIT B (KITS) ×3 IMPLANT
NEEDLE HYPO 22GX1.5 SAFETY (NEEDLE) ×3 IMPLANT
NS IRRIG 1000ML POUR BTL (IV SOLUTION) ×3 IMPLANT
OIL CARTRIDGE MAESTRO DRILL (MISCELLANEOUS) ×3
PACK CRANIOTOMY CUSTOM (CUSTOM PROCEDURE TRAY) ×3 IMPLANT
PAD ARMBOARD 7.5X6 YLW CONV (MISCELLANEOUS) ×3 IMPLANT
PAD DRESSING TELFA 3X8 NADH (GAUZE/BANDAGES/DRESSINGS) ×1 IMPLANT
PATTIES SURGICAL .25X.25 (GAUZE/BANDAGES/DRESSINGS) IMPLANT
PATTIES SURGICAL .5 X.5 (GAUZE/BANDAGES/DRESSINGS) ×2 IMPLANT
PATTIES SURGICAL .5 X3 (DISPOSABLE) IMPLANT
PATTIES SURGICAL 1/4 X 3 (GAUZE/BANDAGES/DRESSINGS) IMPLANT
PATTIES SURGICAL 1X1 (DISPOSABLE) IMPLANT
PIN MAYFIELD SKULL DISP (PIN) IMPLANT
PLATE 1.5 4HOLE LONG STRAIGHT (Plate) ×4 IMPLANT
PLATE 1.5 6HOLE EXT ST L (Plate) ×4 IMPLANT
RUBBERBAND STERILE (MISCELLANEOUS) ×6 IMPLANT
SCREW SELF DRILL HT 1.5/4MM (Screw) ×16 IMPLANT
SEALANT ADHERUS EXTEND TIP (MISCELLANEOUS) ×2 IMPLANT
SPONGE NEURO XRAY DETECT 1X3 (DISPOSABLE) IMPLANT
SPONGE SURGIFOAM ABS GEL 100 (HEMOSTASIS) ×3 IMPLANT
SPONGE SURGIFOAM ABS GEL 100C (HEMOSTASIS) IMPLANT
STAPLER VISISTAT 35W (STAPLE) ×3 IMPLANT
STOCKINETTE 6  STRL (DRAPES) ×2
STOCKINETTE 6 STRL (DRAPES) ×1 IMPLANT
SUT ETHILON 3 0 FSL (SUTURE) IMPLANT
SUT NURALON 4 0 TR CR/8 (SUTURE) ×4 IMPLANT
SUT VIC AB 0 CT1 18XCR BRD8 (SUTURE) ×2 IMPLANT
SUT VIC AB 0 CT1 8-18 (SUTURE) ×12
SUT VIC AB 3-0 SH 8-18 (SUTURE) ×6 IMPLANT
TAPE CLOTH 1X10 TAN NS (GAUZE/BANDAGES/DRESSINGS) ×3 IMPLANT
TOWEL GREEN STERILE (TOWEL DISPOSABLE) ×3 IMPLANT
TOWEL GREEN STERILE FF (TOWEL DISPOSABLE) ×3 IMPLANT
TRAY FOLEY MTR SLVR 16FR STAT (SET/KITS/TRAYS/PACK) IMPLANT
TUBE CONNECTING 20'X1/4 (TUBING) ×1
TUBE CONNECTING 20X1/4 (TUBING) ×1 IMPLANT
UNDERPAD 30X30 (UNDERPADS AND DIAPERS) IMPLANT
WATER STERILE IRR 1000ML POUR (IV SOLUTION) ×3 IMPLANT

## 2018-02-19 NOTE — Progress Notes (Signed)
Radial arterial line no longer appears to be correlating with BP cuff and the wave form is severely dampened.  pt has been instructed to try and not move her wrist as much but she is not compliant with these instructions, will be going off of the BP cuff from here on out.

## 2018-02-19 NOTE — Transfer of Care (Signed)
Immediate Anesthesia Transfer of Care Note  Patient: Joanne Long  Procedure(s) Performed: SUBOCCIPITAL CRANIOTOMY RESECTION OF CEREBELLAR ARTERIO-VENOUS MALFORMATION (N/A Head)  Patient Location: PACU  Anesthesia Type:General  Level of Consciousness: awake, oriented and patient cooperative  Airway & Oxygen Therapy: Patient Spontanous Breathing and Patient connected to face mask oxygen  Post-op Assessment: Report given to RN and Post -op Vital signs reviewed and stable  Post vital signs: Reviewed  Last Vitals:  Vitals Value Taken Time  BP 148/94 02/19/2018  2:10 PM  Temp    Pulse 103 02/19/2018  2:11 PM  Resp 19 02/19/2018  2:11 PM  SpO2 100 % 02/19/2018  2:11 PM  Vitals shown include unvalidated device data.  Last Pain:  Vitals:   02/19/18 0604  TempSrc: Oral         Complications: No apparent anesthesia complications

## 2018-02-19 NOTE — Anesthesia Procedure Notes (Signed)
Central Venous Catheter Insertion Performed by: Kipp Brood, MD, anesthesiologist Start/End9/11/2017 7:05 AM, 02/19/2018 7:15 AM Patient location: Pre-op. Preanesthetic checklist: patient identified, IV checked, site marked, risks and benefits discussed, surgical consent, monitors and equipment checked, pre-op evaluation, timeout performed and anesthesia consent Lidocaine 1% used for infiltration and patient sedated Hand hygiene performed  and maximum sterile barriers used  Catheter size: 8 Fr Total catheter length 16. Central line was placed.Double lumen Procedure performed using ultrasound guided technique. Ultrasound Notes:image(s) printed for medical record Attempts: 1 Following insertion, dressing applied and line sutured. Post procedure assessment: blood return through all ports  Patient tolerated the procedure well with no immediate complications.

## 2018-02-19 NOTE — Anesthesia Procedure Notes (Signed)
Arterial Line Insertion Start/End9/11/2017 7:05 AM, 02/19/2018 7:15 AM Performed by: Lovie Chol, CRNA, CRNA  Patient location: Pre-op. Preanesthetic checklist: patient identified, IV checked, site marked, risks and benefits discussed, surgical consent, monitors and equipment checked and pre-op evaluation Lidocaine 1% used for infiltration and patient sedated Right, radial was placed Catheter size: 20 G Hand hygiene performed  and maximum sterile barriers used   Attempts: 1 Procedure performed without using ultrasound guided technique. Following insertion, dressing applied and Biopatch. Post procedure assessment: normal  Patient tolerated the procedure well with no immediate complications.

## 2018-02-19 NOTE — Brief Op Note (Signed)
  NEUROSURGERY BRIEF OPERATIVE NOTE   PREOP DX: Cerebellar AVM  POSTOP DX: Same  PROCEDURE: Suboccipital craniectomy for resection of AVM  SURGEON: Keneth Borg  ASSISTANT: Pool  ANESTHESIA: GETA  EBL: 500cc  SPECIMENS: Cerebellar AVM  DRAINS: None  COMPLICATIONS: None immediate  CONDITION: Hemodynamically stable to PACU  FINDINGS:  Midline superior cerebellar AVM

## 2018-02-19 NOTE — Anesthesia Procedure Notes (Signed)
Procedure Name: Intubation Date/Time: 02/19/2018 8:16 AM Performed by: Jenne Campus, CRNA Pre-anesthesia Checklist: Patient identified, Emergency Drugs available, Suction available and Patient being monitored Patient Re-evaluated:Patient Re-evaluated prior to induction Oxygen Delivery Method: Circle system utilized Preoxygenation: Pre-oxygenation with 100% oxygen Induction Type: IV induction Ventilation: Oral airway inserted - appropriate to patient size, Two handed mask ventilation required and Mask ventilation without difficulty Laryngoscope Size: Mac and 4 Grade View: Grade II Tube type: Oral Tube size: 7.5 mm Number of attempts: 1 Airway Equipment and Method: Stylet and Oral airway Placement Confirmation: ETT inserted through vocal cords under direct vision,  positive ETCO2,  CO2 detector and breath sounds checked- equal and bilateral Secured at: 21 cm Tube secured with: Tape Dental Injury: Teeth and Oropharynx as per pre-operative assessment  Comments: Performed by Gillian Scarce

## 2018-02-20 LAB — POCT I-STAT 7, (LYTES, BLD GAS, ICA,H+H)
ACID-BASE DEFICIT: 6 mmol/L — AB (ref 0.0–2.0)
Acid-base deficit: 7 mmol/L — ABNORMAL HIGH (ref 0.0–2.0)
Acid-base deficit: 6 mmol/L — ABNORMAL HIGH (ref 0.0–2.0)
Bicarbonate: 20 mmol/L (ref 20.0–28.0)
Bicarbonate: 20.9 mmol/L (ref 20.0–28.0)
Bicarbonate: 19.7 mmol/L — ABNORMAL LOW (ref 20.0–28.0)
CALCIUM ION: 1.19 mmol/L (ref 1.15–1.40)
Calcium, Ion: 1.12 mmol/L — ABNORMAL LOW (ref 1.15–1.40)
Calcium, Ion: 1.1 mmol/L — ABNORMAL LOW (ref 1.15–1.40)
HCT: 35 % — ABNORMAL LOW (ref 36.0–46.0)
HEMATOCRIT: 34 % — AB (ref 36.0–46.0)
HEMATOCRIT: 34 % — AB (ref 36.0–46.0)
HEMOGLOBIN: 11.9 g/dL — AB (ref 12.0–15.0)
HEMOGLOBIN: 11.6 g/dL — AB (ref 12.0–15.0)
Hemoglobin: 11.6 g/dL — ABNORMAL LOW (ref 12.0–15.0)
O2 SAT: 99 %
O2 SAT: 99 %
O2 Saturation: 99 %
PCO2 ART: 42.8 mmHg (ref 32.0–48.0)
PH ART: 7.33 — AB (ref 7.350–7.450)
PO2 ART: 141 mmHg — AB (ref 83.0–108.0)
PO2 ART: 141 mmHg — AB (ref 83.0–108.0)
POTASSIUM: 4.4 mmol/L (ref 3.5–5.1)
Patient temperature: 34.8
Patient temperature: 35.7
Potassium: 4.1 mmol/L (ref 3.5–5.1)
Potassium: 4.4 mmol/L (ref 3.5–5.1)
Sodium: 141 mmol/L (ref 135–145)
Sodium: 142 mmol/L (ref 135–145)
Sodium: 143 mmol/L (ref 135–145)
TCO2: 21 mmol/L — AB (ref 22–32)
TCO2: 22 mmol/L (ref 22–32)
TCO2: 21 mmol/L — ABNORMAL LOW (ref 22–32)
pCO2 arterial: 42.2 mmHg (ref 32.0–48.0)
pCO2 arterial: 36.8 mmHg (ref 32.0–48.0)
pH, Arterial: 7.271 — ABNORMAL LOW (ref 7.350–7.450)
pH, Arterial: 7.288 — ABNORMAL LOW (ref 7.350–7.450)
pO2, Arterial: 131 mmHg — ABNORMAL HIGH (ref 83.0–108.0)

## 2018-02-20 NOTE — Progress Notes (Signed)
Postop day 1.  Overall patient doing very well.  Patient awake and alert.  She is oriented and appropriate.  She complains of moderate posterior headache.  She is having no visual problems.  She is having no diplopia.  She is having no vertigo.  She has no symptoms of facial weakness or lower cranial nerve dysfunction.  Motor examination 5/5 bilaterally without pronator drift.  Dressing dry.  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  Begin to mobilize.  Continue to observe in ICU through the day.

## 2018-02-21 LAB — TYPE AND SCREEN
ABO/RH(D): A POS
ANTIBODY SCREEN: NEGATIVE
Unit division: 0
Unit division: 0

## 2018-02-21 LAB — BPAM RBC
BLOOD PRODUCT EXPIRATION DATE: 201909172359
Blood Product Expiration Date: 201909182359
ISSUE DATE / TIME: 201909060906
ISSUE DATE / TIME: 201909060906
UNIT TYPE AND RH: 6200
Unit Type and Rh: 6200

## 2018-02-21 MED ORDER — KETOROLAC TROMETHAMINE 30 MG/ML IJ SOLN
30.0000 mg | Freq: Four times a day (QID) | INTRAMUSCULAR | Status: DC | PRN
  Administered 2018-02-21 – 2018-02-24 (×5): 30 mg via INTRAVENOUS
  Filled 2018-02-21 (×5): qty 1

## 2018-02-21 MED ORDER — NICARDIPINE HCL IN NACL 40-0.83 MG/200ML-% IV SOLN: 3.0000 mg/h | INTRAVENOUS | Status: DC

## 2018-02-21 MED ORDER — METHOCARBAMOL 500 MG PO TABS
500.0000 mg | ORAL_TABLET | Freq: Four times a day (QID) | ORAL | Status: DC | PRN
  Administered 2018-02-21 – 2018-02-22 (×3): 500 mg via ORAL
  Filled 2018-02-21 (×3): qty 1

## 2018-02-21 NOTE — Progress Notes (Signed)
Postop day 2.  Patient still with quite a bit of pain.  Mobility very poor secondary to pain.  No new neurologic symptoms.  Headache reasonably well controlled aside from the incisional discomfort.  No upper or lower extremity symptoms.  Afebrile.  Vital signs are stable.  She is awake and alert.  She is oriented and appropriate.  His cranial nerve function is intact.  Her motor examination of her extremities is normal.  Wound clean and dry.  Chest and abdomen stable.  Progressing reasonably well following suboccipital craniectomy and AVM resection.  Continue supportive efforts.  Begin to mobilize as tolerated.  Patient not ready for discharge home as of yet.

## 2018-02-21 NOTE — Progress Notes (Signed)
Spoke with Marchelle Folks, RN concerning order to remove central line. She informed this Thereasa Parkin that it has been removed.

## 2018-02-22 ENCOUNTER — Encounter (HOSPITAL_COMMUNITY): Payer: Self-pay | Admitting: Neurosurgery

## 2018-02-22 ENCOUNTER — Other Ambulatory Visit (HOSPITAL_COMMUNITY): Payer: Self-pay | Admitting: *Deleted

## 2018-02-22 MED ORDER — OXYCODONE HCL 5 MG PO TABS
5.0000 mg | ORAL_TABLET | ORAL | Status: DC | PRN
  Administered 2018-02-22 – 2018-02-24 (×8): 10 mg via ORAL
  Filled 2018-02-22 (×8): qty 2

## 2018-02-22 MED ORDER — OXYCODONE-ACETAMINOPHEN 7.5-325 MG PO TABS
1.0000 | ORAL_TABLET | ORAL | Status: DC | PRN
  Administered 2018-02-22 – 2018-02-23 (×2): 1 via ORAL
  Filled 2018-02-22 (×2): qty 1

## 2018-02-22 MED ORDER — METHOCARBAMOL 500 MG PO TABS
750.0000 mg | ORAL_TABLET | Freq: Four times a day (QID) | ORAL | Status: DC | PRN
  Administered 2018-02-22 – 2018-02-24 (×7): 750 mg via ORAL
  Filled 2018-02-22 (×7): qty 2

## 2018-02-22 MED FILL — Thrombin (Recombinant) For Soln 5000 Unit: CUTANEOUS | Qty: 5000 | Status: AC

## 2018-02-22 MED FILL — Thrombin For Soln Kit 20000 Unit: CUTANEOUS | Qty: 1 | Status: AC

## 2018-02-22 NOTE — Progress Notes (Addendum)
Neurosurgery Progress Note  No issues overnight. Continues to have severe, throbbing occipital headache and posterior neck pain. Feels more comfortable keeping head still. No associated changes in vision, dizziness, pre-syncope, nausea, vomiting, focal deficit.  EXAM:  BP 128/83 (BP Location: Left Arm)   Pulse (!) 58   Temp 98 F (36.7 C) (Axillary)   Resp 13   Ht 5\' 8"  (1.727 m)   Wt (!) 137 kg   SpO2 96%   BMI 45.92 kg/m   Awake, alert, oriented  Speech fluent, appropriate  CN grossly intact  MAEW with symmetric strength Incision: c/d/i  IMPRESSION/PLAN 42 y.o. female POD #3 suboccipital craniectomy for resection of AVM. She is neurologically intact. Continues to have throbbing headache. Component does appear muscular in nature.  - Increase Robaxin from 500mg  to 750mg  q 6 hours - D/c Norco. Start Oxycodone prn for pain - Will continue to work on pain control today - Will plan on repeat angio tomorrow    I have seen and examined [employer] and agree with the exam, impression, and plan as documented in the note by [employer], PA-C.  Lisbeth Renshaw, MD [facility]

## 2018-02-23 ENCOUNTER — Encounter (HOSPITAL_COMMUNITY): Payer: Self-pay | Admitting: Neurosurgery

## 2018-02-23 ENCOUNTER — Inpatient Hospital Stay (HOSPITAL_COMMUNITY): Payer: 59

## 2018-02-23 HISTORY — PX: IR ANGIO VERTEBRAL SEL VERTEBRAL BILAT MOD SED: IMG5369

## 2018-02-23 HISTORY — PX: IR ANGIO INTRA EXTRACRAN SEL COM CAROTID INNOMINATE BILAT MOD SED: IMG5360

## 2018-02-23 MED ORDER — FENTANYL CITRATE (PF) 100 MCG/2ML IJ SOLN
INTRAMUSCULAR | Status: AC
  Filled 2018-02-23: qty 2

## 2018-02-23 MED ORDER — OXYCODONE-ACETAMINOPHEN 7.5-325 MG PO TABS: 1.0000 | ORAL_TABLET | ORAL | 0 refills | Status: DC | PRN

## 2018-02-23 MED ORDER — FENTANYL CITRATE (PF) 100 MCG/2ML IJ SOLN
INTRAMUSCULAR | Status: AC | PRN
  Administered 2018-02-23: 25 ug via INTRAVENOUS

## 2018-02-23 MED ORDER — MIDAZOLAM HCL 2 MG/2ML IJ SOLN
INTRAMUSCULAR | Status: AC
  Filled 2018-02-23: qty 2

## 2018-02-23 MED ORDER — LIDOCAINE HCL 1 % IJ SOLN
INTRAMUSCULAR | Status: AC
  Administered 2018-02-23: 11:00:00
  Filled 2018-02-23: qty 20

## 2018-02-23 MED ORDER — IOPAMIDOL (ISOVUE-300) INJECTION 61%
INTRAVENOUS | Status: AC
  Administered 2018-02-23: 50 mL
  Filled 2018-02-23: qty 100

## 2018-02-23 MED ORDER — HEPARIN SODIUM (PORCINE) 1000 UNIT/ML IJ SOLN
INTRAMUSCULAR | Status: AC
  Filled 2018-02-23: qty 1

## 2018-02-23 MED ORDER — LEVETIRACETAM 500 MG PO TABS: 500.0000 mg | ORAL_TABLET | Freq: Two times a day (BID) | ORAL | 2 refills | Status: DC

## 2018-02-23 MED ORDER — METHOCARBAMOL 750 MG PO TABS: 750.0000 mg | ORAL_TABLET | Freq: Four times a day (QID) | ORAL | 2 refills | Status: DC | PRN

## 2018-02-23 MED ORDER — MIDAZOLAM HCL 2 MG/2ML IJ SOLN
INTRAMUSCULAR | Status: AC | PRN
  Administered 2018-02-23: 1 mg via INTRAVENOUS

## 2018-02-23 NOTE — Brief Op Note (Signed)
  NEUROSURGERY BRIEF OP NOTE   PREOP DX: Cerebellar AVM  POSTOP DX: Same  PROCEDURE: Diagnostic cerebral angiogram  SURGEON: Dr. Lisbeth Renshaw, MD  ANESTHESIA: IV Sedation with Local  EBL: Minimal  SPECIMENS: None  COMPLICATIONS: None  CONDITION: Stable to floor  FINDINGS: 1. 6 vessel angiogram does not demonstrate any evidence of residual AVM, no early venous drainage. Otherwise normal angiogram

## 2018-02-23 NOTE — Progress Notes (Addendum)
Neurosurgery Progress Note  No issues overnight. Pain slightly more manageable with adjustments yesterday No new concerns.   EXAM:  BP (!) 143/84 (BP Location: Right Arm)   Pulse 63   Temp 97.7 F (36.5 C)   Resp 13   Ht 5\' 8"  (1.727 m)   Wt (!) 137 kg   SpO2 96%   BMI 45.92 kg/m   Awake, alert, oriented  Speech fluent, appropriate  CN grossly intact  5/5 BUE/BLE  Incision: c/d/i  IMPRESSION/PLAN 42 y.o. female POD #4 suboccipital craniectomy for resection of AVM. She is neurologically intact. Pain slightly improved from yesterday.  - Repeat angio later today - Soft neck collar for neck pain/tightness - Hopeful for d/c after angio later today   I have seen and examined Joanne Long and agree with the exam, impression, and plan as documented in the note by Joanne Presume, Joanne Long.  Joanne Renshaw, MD [facility]

## 2018-02-23 NOTE — Discharge Summary (Addendum)
Physician Discharge Summary  Patient ID: TUNJA IVINS MRN: [id number] DOB/AGE: 42/21/1977 42 y.o.  Admit date: 02/19/2018 Discharge date: 02/24/2018  Admission Diagnoses:  AVM  Discharge Diagnoses:  Same Active Problems:   AVM (arteriovenous malformation) brain   Discharged Condition: Stable  Hospital Course:  Joanne Long is a 42 y.o. female who was admitted for the below procedure. There were no post operative complications. Repeat cerebral angio performed on 9/10 showed complete resection of AVM. At time of discharge, pain was well controlled, ambulating with Pt/OT, tolerating po, voiding normal. Ready for discharge.  Treatments: Surgery - Suboccipital craniectomy for resection of AVM  Discharge Exam: Blood pressure 118/76, pulse (!) 52, temperature 98.2 F (36.8 C), temperature source Oral, resp. rate 12, height 5\' 8"  (1.727 m), weight (!) 137 kg, SpO2 96 %. Awake, alert, oriented Speech fluent, appropriate CN grossly intact 5/5 BUE/BLE Wound c/d/i  Disposition: Discharge disposition: 01-Home or Self Care       Discharge Instructions    Call MD for:  difficulty breathing, headache or visual disturbances   Complete by:  As directed    Call MD for:  persistant dizziness or light-headedness   Complete by:  As directed    Call MD for:  redness, tenderness, or signs of infection (pain, swelling, redness, odor or green/yellow discharge around incision site)   Complete by:  As directed    Call MD for:  severe uncontrolled pain   Complete by:  As directed    Call MD for:  temperature >100.4   Complete by:  As directed    Diet general   Complete by:  As directed    Driving Restrictions   Complete by:  As directed    Do not drive until given clearance.   Increase activity slowly   Complete by:  As directed    Lifting restrictions   Complete by:  As directed    Do not lift anything >10lbs. Avoid bending and twisting in awkward positions. Avoid bending at the back.    May shower / Bathe   Complete by:  As directed    In 24 hours. Okay to wash wound with warm soapy water. Avoid scrubbing the wound. Pat dry.   Remove dressing in 24 hours   Complete by:  As directed      Allergies as of 02/24/2018   No Known Allergies     Medication List    STOP taking these medications   dexamethasone 2 MG tablet Commonly known as:  DECADRON     TAKE these medications   ibuprofen 800 MG tablet Commonly known as:  ADVIL,MOTRIN Take 800 mg by mouth daily as needed for headache.   lamoTRIgine 100 MG tablet Commonly known as:  LAMICTAL 150 mg every morning and 200 every night   levETIRAcetam 500 MG tablet Commonly known as:  KEPPRA Take 1 tablet (500 mg total) by mouth 2 (two) times daily.   levonorgestrel 20 MCG/24HR IUD Commonly known as:  MIRENA 1 each by Intrauterine route once.   methocarbamol 750 MG tablet Commonly known as:  ROBAXIN Take 1 tablet (750 mg total) by mouth every 6 (six) hours as needed for muscle spasms.   metroNIDAZOLE 0.75 % cream Commonly known as:  METROCREAM Apply 1 application topically 3 (three) times a week.   oxyCODONE-acetaminophen 7.5-325 MG tablet Commonly known as:  PERCOCET Take 1 tablet by mouth every 4 (four) hours as needed for moderate pain or severe pain.   rizatriptan  10 MG tablet Commonly known as:  MAXALT TAKE 1 TABLET FOR MIGRAINE. MAY REPEAT IN 2 HOURS. NO MORE THAN 2/24 HOURS OR 2/3 DAYS   topiramate 100 MG tablet Commonly known as:  TOPAMAX Take 1 tablet (100 mg total) by mouth 2 (two) times daily. What changed:  how much to take      Follow-up Information    Lisbeth Renshaw, MD. Schedule an appointment as soon as possible for a visit in 3 week(s).   Specialty:  Neurosurgery Contact information: [address] [place] [address] [phone]           Signed: Alyson Ingles 02/24/2018, 7:49 AM

## 2018-02-23 NOTE — Op Note (Signed)
NEUROSURGERY OPERATIVE NOTE   PREOP DIAGNOSIS:  1. Cerebellar AVM   POSTOP DIAGNOSIS: Same  PROCEDURE: 1. Suboccipital craniectomy for resection of cerebellar arteriovenous malformation 2. Use of intraoperative microscope for microdissection  SURGEON: Dr. Lisbeth Renshaw, MD  ASSISTANT: Dr. Julio Sicks, MD  ANESTHESIA: General Endotracheal  EBL: 500cc  SPECIMENS: cerebellar AVM  DRAINS: None  COMPLICATIONS: None immediate  CONDITION: Hemodynamically stable to PACU  HISTORY: Joanne Long is a 42 y.o. female who was initially seen after she suffered a posterior fossa hemorrhage about a year ago.  At that time she did undergo diagnostic angiogram which demonstrated the likelihood of a cerebellar arteriovenous malformation.  She initially underwent treatment via endovascular embolization in an attempt to cure the AVM.  Follow-up angiography demonstrated continued filling of the AVM with early venous drainage.  Given her age, previous hemorrhage, and continued filling, surgical resection was therefore indicated.  The risks and benefits of the surgery were explained in detail to the patient and her family.  After all questions were answered informed consent was obtained and witnessed.  PROCEDURE IN DETAIL: The patient was brought to the operating room. After induction of general anesthesia, the patient was positioned on the operative table in the [place] head holder in the prone position. All pressure points were meticulously padded.  Midline skin incision was then marked out and prepped and draped in the usual sterile fashion.  After timeout was conducted, midline incision was infiltrated with local anesthetic with epinephrine extending from approximately the level of the inion down to the mid cervical spine.  Incision was then made sharply and Bovie electrocautery was used to dissected subcutaneous tissue.  The nuchal fascia was identified and incised in Y shape, based superiorly.   The suboccipital musculature was then divided in the avascular midline plane.  Spinous process at C2, C1, and the occipital bone were then all identified.  Subperiosteal dissection was carried out to identify the lateral margins of the foramen magnum.  Self-retaining retractors were then placed.  Using the high-speed drill, a suboccipital craniectomy was fashioned.  This was carried down to the foramen magnum.  Hemostasis on the bone edges was secured with bone wax.  Once good hemostasis was achieved on the bone edges and the epidural surface, the microscope was draped sterilely and brought into the field, and the remainder of the case was done under the microscope using microdissection technique.  The dura was initially opened in a Y-shaped fashion over the tonsils, and in the midline towards the level of the foramen magnum.  Initially, the arachnoid overlying the cisterna magna was opened in order to allow CSF egress and some brain relaxation so that I could visualize the tentorial surface of the cerebellum.  I did note some gliotic tissue, and a relatively robust what appeared to be arterialized vein just to the right of midline coursing toward the tentorium.  Unfortunately, there were significant adhesions from the sella cerebellum to the tentorium preventing visualization of the tentorial surface of the cerebellum.  I therefore elected at this point to extend the craniectomy slightly superiorly.  This was done with a high-speed drill.  Again, hemostasis on the bone edge was secured with bone wax.  Dural opening was then extended slightly superiorly.  This did allow visualization of the tentorial surface of the cerebellum to the right of midline and this was dissected.  I was then able to visualize a portion of the tentorial surface lateral to the AVM and the adhesions  on the left and again this was dissected.    At this point, I began dissecting the AVM with primarily bipolar electrocautery.  During  circumferential dissection of the AVM, I did note some vessels which were thrombosed, filled with the Onyx embolization agent.  I continued to circumferentially dissect the AVM and what appeared to be a slightly gliotic capsule, initially along the midline, followed by inferiorly and towards the left.  As this occurred, I was slightly able to mobilize the AVM and visualize the more deep surface which was also dissected free.  Finally, I coagulated the vein coursing towards the tentorium, and cut the AVM and it was removed in a single piece and sent for permanent pathology.  I then inspected the resection cavity.  There was minimal amount of bleeding from the Kruger matter which was easily controlled with morselized Gelfoam and thrombin.  The resection cavity was then irrigated with copious amounts of normal saline irrigation.  Attention was then turned to dural closure.  It became apparent that because of the desiccated dura, and multiple dural incisions, a primary watertight closure would be impossible.  I therefore placed a collagen DuraMatrix onlay graft over the cerebellum.  The dural leaflets were then reapproximated over the inlay with 4-0 Nurolon stitches.  A layer of polyethylene glycol dural sealant was then applied.  The wound was again irrigated with copious amounts of normal saline irrigation.  The wound was then closed in multiple layers in standard fashion using a combination of 0 and 3-0 Vicryl stitches.  Skin was closed in a interrupted subcuticular stitch with 3-0 Vicryl.  Dermabond was applied.  Sterile dressing was then applied.  The patient was then removed from the [place] head holder and transferred to the stretcher.  She was then extubated and taken to the postanesthesia care unit in stable hemodynamic condition.  At the end of the case all sponge, needle, instrument, and cottonoid counts were correct.   At the end of the case all sponge, needle, and instrument counts were correct.  The patient was then transferred to the stretcher, extubated, and taken to the post-anesthesia care unit in stable hemodynamic condition.

## 2018-02-24 NOTE — Progress Notes (Signed)
Orthopedic Tech Progress Note Patient Details:  Joanne Long 1975-11-17 [id number]  Ortho Devices Type of Ortho Device: Soft collar Ortho Device/Splint Location: neck Ortho Device/Splint Interventions: Application   Post Interventions Patient Tolerated: Well Instructions Provided: Care of device   Nikki Dom 02/24/2018, 10:43 AM

## 2018-02-24 NOTE — Anesthesia Postprocedure Evaluation (Signed)
Anesthesia Post Note  Patient: Joanne Long  Procedure(s) Performed: SUBOCCIPITAL CRANIOTOMY RESECTION OF CEREBELLAR ARTERIO-VENOUS MALFORMATION (N/A Head)     Patient location during evaluation: PACU Anesthesia Type: General Level of consciousness: awake and alert Pain management: pain level controlled Vital Signs Assessment: post-procedure vital signs reviewed and stable Respiratory status: spontaneous breathing, nonlabored ventilation, respiratory function stable and patient connected to nasal cannula oxygen Cardiovascular status: blood pressure returned to baseline and stable Postop Assessment: no apparent nausea or vomiting Anesthetic complications: no    Last Vitals:  Vitals:   02/23/18 2315 02/24/18 0400  BP: 138/68 118/76  Pulse: 65 (!) 52  Resp:  12  Temp:  36.8 C  SpO2: 94% 96%    Last Pain:  Vitals:   02/24/18 0530  TempSrc:   PainSc: 8                  Sevana Grandinetti COKER

## 2018-02-24 NOTE — Progress Notes (Signed)
Neurosurgery Progress Note  No issues overnight.  Pain much improved Eager for d/c today  EXAM:  BP 118/76 (BP Location: Left Arm)   Pulse (!) 52   Temp 98.2 F (36.8 C) (Oral)   Resp 12   Ht 5\' 8"  (1.727 m)   Wt (!) 137 kg   SpO2 96%   BMI 45.92 kg/m   Awake, alert, oriented  Speech fluent, appropriate  CN grossly intact  5/5 BUE/BLE   No drift Incision c/d/i  PLAN Much improved from yesterday Angio yesterday shows complete resection of AVM Has not yet worked with therapy so will place consult. Cleared for d/c today after soft collar obtained and working with therapy

## 2018-02-24 NOTE — Progress Notes (Signed)
Occupational Therapy Evaluation Patient Details Name: Joanne Long MRN: [id number] DOB: 1975/08/27 Today's Date: 02/24/2018    History of Present Illness Pt is a 42 y/o female s/p craniectomy and resection of cerebellar AVM. PMH includes seizures and cerebellar hemmorhage s/p embolization.    Clinical Impression   PTA, pt independent with ADL and mobility. Completed education regarding compensatory techniques for ADL and functional mobility. No further OT needs.     Follow Up Recommendations  No OT follow up;Supervision - Intermittent    Equipment Recommendations  None recommended by OT    Recommendations for Other Services       Precautions / Restrictions Precautions Precautions: Other (comment) Precaution Comments: Per notes no bending, twisting, or lifting more than 10 lbs Required Braces or Orthoses: Cervical Brace Cervical Brace: Soft collar Restrictions Weight Bearing Restrictions: No      Mobility Bed Mobility               General bed mobility comments: In chair upon entry. Discussed use of log roll technique at home   Transfers Overall transfer level: Modified independent          Balance Overall balance assessment: Needs assistance Sitting-balance support: No upper extremity supported;Feet supported Sitting balance-[name]: Good     Standing balance support: During functional activity;Single extremity supported;No upper extremity supported Standing balance-[name]: Good Standing balance comment: Able to maintain static standing without UE support                            ADL either performed or assessed with clinical judgement   ADL Overall ADL's : Needs assistance/impaired                                     Functional mobility during ADLs: Modified independent General ADL Comments: Completed education regarding compensatory techniques for ADL and funcitonal mobility for ADL to adhere to no bending,  lifting and twisting.  Educated on home set up and modifications to increase independence with ADL and functional mobility. Pt verbalized understanding.  Handout reviewed and provided.      Vision         Perception     Praxis      Pertinent Vitals/Pain Pain Assessment: 0-10 Pain Score: 4  Pain Location: back of head  Pain Descriptors / Indicators: Aching Pain Intervention(s): Limited activity within patient's tolerance     Hand Dominance Right   Extremity/Trunk Assessment Upper Extremity Assessment Upper Extremity Assessment: Overall WFL for tasks assessed   Lower Extremity Assessment Lower Extremity Assessment: Defer to PT evaluation   Cervical / Trunk Assessment Cervical / Trunk Assessment: Other exceptions Cervical / Trunk Exceptions: s/p craniectomy   Communication Communication Communication: No difficulties   Cognition Arousal/Alertness: Awake/alert Behavior During Therapy: WFL for tasks assessed/performed Overall Cognitive Status: Within Functional Limits for tasks assessed                                     General Comments  Pt's mother present throughout session.  Mom will provide necessary level of assistance after DC    Exercises     Shoulder Instructions      Home Living Family/patient expects to be discharged to:: Private residence Living Arrangements: Parent;Other relatives(other relatives ) Available Help at Discharge:  Family;Available 24 hours/day Type of Home: House Home Access: Stairs to enter [employer] of Steps: 3 Entrance Stairs-Rails: Right;Left Home Layout: One level     Bathroom Shower/Tub: Chief Strategy Officer: Standard Bathroom Accessibility: Yes How Accessible: Accessible via walker Home Equipment: Walker - 4 wheels;Cane - single point;Hand held shower head;Adaptive equipment Adaptive Equipment: Reacher        Prior Functioning/Environment Level of Independence: Independent         Comments: trying to become a teacher        OT Problem List: Decreased range of motion;Decreased knowledge of precautions;Pain      OT Treatment/Interventions:      OT Goals(Current goals can be found in the care plan section) Acute Rehab OT Goals Patient Stated Goal: to go home  OT Goal Formulation: All assessment and education complete, DC therapy  OT Frequency:     Barriers to D/C:            Co-evaluation              AM-PAC PT "6 Clicks" Daily Activity     Outcome Measure Help from another person eating meals?: None Help from another person taking care of personal grooming?: None Help from another person toileting, which includes using toliet, bedpan, or urinal?: None Help from another person bathing (including washing, rinsing, drying)?: None Help from another person to put on and taking off regular upper body clothing?: None Help from another person to put on and taking off regular lower body clothing?: None 6 Click Score: 24   End of Session Equipment Utilized During Treatment: Cervical collar Nurse Communication: Mobility status  Activity Tolerance: Patient tolerated treatment well Patient left: in chair;with call bell/phone within reach  OT Visit Diagnosis: Unsteadiness on feet (R26.81)                Time: 1140-1157 OT Time Calculation (min): 17 min Charges:  OT General Charges $OT Visit: 1 Visit OT Evaluation $OT Eval Low Complexity: 1 Low  Luisa Dago, OT/L   Acute OT Clinical Specialist Acute Rehabilitation Services Pager [phone] Office [phone]   [facility] 02/24/2018, 12:06 PM

## 2018-02-24 NOTE — Evaluation (Signed)
Physical Therapy Evaluation Patient Details Name: Joanne Long MRN: [id number] DOB: Apr 04, 1976 Today's Date: 02/24/2018   History of Present Illness  Pt is a 42 y/o female s/p craniectomy and resection of cerebellar AVM. PMH includes seizures and cerebellar hemmorhage s/p embolization.   Clinical Impression  Pt is s/p surgery above with deficits below. Pt very guarded and cautious during ambulation, however, no LOB noted. Pt reaching out for handrail and furniture for safety; educated about use of DME at home to increase confidence with mobility. Pt reports mother and brother will be able to assist at d/c. Will continue to follow acutely to maximize functional mobility independence and safety.     Follow Up Recommendations No PT follow up;Supervision for mobility/OOB    Equipment Recommendations  None recommended by PT    Recommendations for Other Services       Precautions / Restrictions Precautions Precautions: Other (comment) Precaution Comments: Per notes no bending, twisting, or lifting more than 10 lbs Required Braces or Orthoses: Cervical Brace Cervical Brace: Soft collar Restrictions Weight Bearing Restrictions: No      Mobility  Bed Mobility               General bed mobility comments: In chair upon entry. Discussed use of log roll technique at home   Transfers Overall transfer level: Needs assistance Equipment used: None Transfers: Sit to/from Stand Sit to Stand: Supervision         General transfer comment: Supervision for safety.   Ambulation/Gait Ambulation/Gait assistance: Supervision;Min guard Gait Distance (Feet): 125 Feet Assistive device: None Gait Pattern/deviations: Step-through pattern;Decreased stride length Gait velocity: Decreased    General Gait Details: Pt very cautious and very guarded. Reaching out for rail in hall and furniture in the room. Educated about use of walker to improve safety and increase confidence during ambulation.  No LOB noted throughout.   Stairs Stairs: Yes       General stair comments: Verbally reviewed sidestep technique and use of BUE on 1 handrail to perform stair navigation. Pt reports brother can assist as well.   Wheelchair Mobility    Modified Rankin (Stroke Patients Only)       Balance Overall balance assessment: Needs assistance Sitting-balance support: No upper extremity supported;Feet supported Sitting balance-[name]: Good     Standing balance support: During functional activity;Single extremity supported;No upper extremity supported Standing balance-[name]: Fair Standing balance comment: Able to maintain static standing without UE support                              Pertinent Vitals/Pain Pain Assessment: 0-10 Pain Score: 6  Pain Location: back of head  Pain Descriptors / Indicators: Aching Pain Intervention(s): Limited activity within patient's tolerance;Monitored during session;Repositioned    Home Living Family/patient expects to be discharged to:: Private residence Living Arrangements: Parent;Other relatives(other relatives ) Available Help at Discharge: Family;Available 24 hours/day Type of Home: House Home Access: Stairs to enter Entrance Stairs-Rails: Doctor, general practice of Steps: 3 Home Layout: One level Home Equipment: Walker - 4 wheels;Cane - single point      Prior Function Level of Independence: Independent               Hand Dominance        Extremity/Trunk Assessment   Upper Extremity Assessment Upper Extremity Assessment: Defer to OT evaluation    Lower Extremity Assessment Lower Extremity Assessment: Overall WFL for tasks assessed  Cervical / Trunk Assessment Cervical / Trunk Assessment: Other exceptions Cervical / Trunk Exceptions: s/p craniectomy  Communication   Communication: No difficulties  Cognition Arousal/Alertness: Awake/alert Behavior During Therapy: WFL for tasks  assessed/performed Overall Cognitive Status: Within Functional Limits for tasks assessed                                        General Comments General comments (skin integrity, edema, etc.): Pt's mother present throughout session.     Exercises     Assessment/Plan    PT Assessment Patient needs continued PT services  PT Problem List Decreased mobility;Decreased knowledge of use of DME;Decreased knowledge of precautions;Pain       PT Treatment Interventions DME instruction;Gait training;Stair training;Therapeutic exercise;Therapeutic activities;Functional mobility training;Patient/family education    PT Goals (Current goals can be found in the Care Plan section)  Acute Rehab PT Goals Patient Stated Goal: to go home  PT Goal Formulation: With patient Time For Goal Achievement: 03/10/18 Potential to Achieve Goals: Good    Frequency Min 4X/week   Barriers to discharge        Co-evaluation               AM-PAC PT "6 Clicks" Daily Activity  Outcome Measure Difficulty turning over in bed (including adjusting bedclothes, sheets and blankets)?: A Little Difficulty moving from lying on back to sitting on the side of the bed? : A Little Difficulty sitting down on and standing up from a chair with arms (e.g., wheelchair, bedside commode, etc,.)?: A Little Help needed moving to and from a bed to chair (including a wheelchair)?: A Little Help needed walking in hospital room?: A Little Help needed climbing 3-5 steps with a railing? : A Little 6 Click Score: 18    End of Session Equipment Utilized During Treatment: Gait belt;Cervical collar Activity Tolerance: Patient tolerated treatment well Patient left: in chair;with call bell/phone within reach;with family/visitor present Nurse Communication: Mobility status PT Visit Diagnosis: Other abnormalities of gait and mobility (R26.89);Pain Pain - part of body: (back of neck/head )    Time: 1100-1120 PT Time  Calculation (min) (ACUTE ONLY): 20 min   Charges:   PT Evaluation $PT Eval Low Complexity: 1 Low          Joanne Long, PT, DPT  Acute Rehabilitation Services  Pager: [phone] Office: [phone]   Joanne Long 02/24/2018, 11:35 AM

## 2018-02-26 ENCOUNTER — Other Ambulatory Visit: Payer: Self-pay | Admitting: Radiation Therapy

## 2018-03-23 ENCOUNTER — Other Ambulatory Visit: Payer: Self-pay | Admitting: Nurse Practitioner

## 2018-05-04 ENCOUNTER — Telehealth: Payer: Self-pay | Admitting: Neurology

## 2018-05-04 NOTE — Telephone Encounter (Signed)
[facility]. She was seen once in July with h/a, sz.. Had AVM repair in Sept. Denies any sz. like activity and reports compliance with sz. meds. She would like to discuss the possibility that the AVM was the cause for past sz., and her chances of being able to d/c sz. meds now that it has been repaired. We discussed that Dr. Anne HahnWillis may order a repeat EEG at her f/u visit in Jan. Prior to f/u appt., if she has any concerning sx. or sz. like activity, she will call for sooner appt. She should continue sz. meds until her f/u appt., when she and [facility] will have the opportunity to make decisions together.  She verbalized understanding of same and is agreeable with this plan/fim

## 2018-05-04 NOTE — Telephone Encounter (Signed)
Pt requesting a call, stating she would like to know if Dr. Anne HahnWillis would like her to have an EEG to know if she is still having seizure activity

## 2018-06-10 ENCOUNTER — Other Ambulatory Visit: Payer: Self-pay | Admitting: Neurology

## 2018-06-18 DIAGNOSIS — Z0289 Encounter for other administrative examinations: Secondary | ICD-10-CM

## 2018-06-21 ENCOUNTER — Telehealth: Payer: Self-pay

## 2018-06-21 NOTE — Telephone Encounter (Signed)
[employer] accommodation eval forms completed by Dr. Anne Hahn. Copies made and filed and original forms fwd back to medical records for processing. MB RN

## 2018-06-24 ENCOUNTER — Telehealth: Payer: Self-pay | Admitting: *Deleted

## 2018-06-24 NOTE — Telephone Encounter (Signed)
I faxed pt form to [facility] on 06/24/18

## 2018-06-28 ENCOUNTER — Telehealth: Payer: Self-pay | Admitting: *Deleted

## 2018-06-28 NOTE — Telephone Encounter (Signed)
Requested a call back to further discuss this message.  

## 2018-06-28 NOTE — Telephone Encounter (Signed)
Pt has question about [employer] form requesting 8 hours off for her  headaches. Please call [phone]

## 2018-06-29 NOTE — Telephone Encounter (Signed)
Requested a call back to discuss pt's questions.

## 2018-07-01 NOTE — Telephone Encounter (Signed)
Left 3rd vm for pt to call back and discuss questions.

## 2018-07-12 ENCOUNTER — Ambulatory Visit (INDEPENDENT_AMBULATORY_CARE_PROVIDER_SITE_OTHER): Payer: 59 | Admitting: Neurology

## 2018-07-12 ENCOUNTER — Encounter: Payer: Self-pay | Admitting: Neurology

## 2018-07-12 VITALS — BP 126/84 | HR 90 | Ht 68.0 in | Wt 295.0 lb

## 2018-07-12 DIAGNOSIS — G40909 Epilepsy, unspecified, not intractable, without status epilepticus: Secondary | ICD-10-CM

## 2018-07-12 DIAGNOSIS — Z5181 Encounter for therapeutic drug level monitoring: Secondary | ICD-10-CM | POA: Diagnosis not present

## 2018-07-12 DIAGNOSIS — G43809 Other migraine, not intractable, without status migrainosus: Secondary | ICD-10-CM | POA: Diagnosis not present

## 2018-07-12 NOTE — Progress Notes (Signed)
Reason for visit: Seizures, migraine  Joanne Long is an 43 y.o. female  History of present illness:  Joanne Long is a 43 year old right-handed [place] female with a history of morbid obesity, migraine headaches, and seizures.  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  The patient has done well following this surgery.  She has been out of work until 25 May 2018.  When she was out of work, her headaches reduced significantly, having only 1 or 2 severe headaches a month.  When she went back to work, the patient began having about 3 severe headaches a month and a total of around 7 headache days a month.  The patient has come off of her Topamax, there have been no changes in her headache frequency.  Maxalt still works for the headache, she may also take Advil.  She remains on Lamictal taking 150 mg in the morning and 200 mg in the evening.  She tolerates this drug well.  She has not had any recurrent seizures.  Past Medical History:  Diagnosis Date  . Anemia   . Anxiety   . Migraine   . Seizures (HCC)    years ago    Past Surgical History:  Procedure Laterality Date  . BACK SURGERY     Lumbar  . CRANIOTOMY N/A 02/19/2018   Procedure: SUBOCCIPITAL CRANIOTOMY RESECTION OF CEREBELLAR ARTERIO-VENOUS MALFORMATION;  Surgeon: Lisbeth Renshaw, MD;  Location: [facility] OR;  Service: Neurosurgery;  Laterality: N/A;  SUBOCCIPITAL CRANIOTOMY RESECTION OF CEREBELLAR ARTERIO-VENOUS MALFORMATION  . IR ANGIO EXTERNAL CAROTID SEL EXT CAROTID BILAT MOD SED  03/06/2017  . IR ANGIO INTRA EXTRACRAN SEL COM CAROTID INNOMINATE BILAT MOD SED  11/24/2017  . IR ANGIO INTRA EXTRACRAN SEL COM CAROTID INNOMINATE BILAT MOD SED  02/23/2018  . IR ANGIO INTRA EXTRACRAN SEL INTERNAL CAROTID BILAT MOD SED  03/06/2017  . IR ANGIO VERTEBRAL SEL VERTEBRAL BILAT MOD SED  03/06/2017  . IR ANGIO VERTEBRAL SEL VERTEBRAL BILAT MOD SED  11/24/2017  . IR ANGIO VERTEBRAL SEL VERTEBRAL BILAT MOD  SED  02/23/2018  . IR ANGIO VERTEBRAL SEL VERTEBRAL UNI L MOD SED  04/28/2017  . IR NEURO EACH ADD'L AFTER BASIC UNI LEFT (MS)  04/28/2017  . IR TRANSCATH/EMBOLIZ  04/28/2017  . RADIOLOGY WITH ANESTHESIA N/A 04/28/2017   Procedure: Arteriogram, Onyx embolization of AVM;  Surgeon: Lisbeth Renshaw, MD;  Location: [facility] OR;  Service: Radiology;  Laterality: N/A;  . TONSILLECTOMY    . WISDOM TOOTH EXTRACTION      Family History  Problem Relation Age of Onset  . Crohn's disease Sister   . Diabetes Brother   . Other Brother        hypoglycemic  . Thyroid disease Neg Hx     Social history:  reports that she has never smoked. She has never used smokeless tobacco. She reports that she does not drink alcohol or use drugs.   No Known Allergies  Medications:  Prior to Admission medications   Medication Sig Start Date End Date Taking? Authorizing Provider  ibuprofen (ADVIL,MOTRIN) 800 MG tablet Take 800 mg by mouth daily as needed for headache.    Yes [provider]  lamoTRIgine (LAMICTAL) 100 MG tablet TAKE 1&1/2 TABLETS BY MOUTH EVERY MORNING AND 2 TABLETS EVERY NIGHT 03/23/18  Yes Nilda Riggs, NP  levonorgestrel (MIRENA) 20 MCG/24HR IUD 1 each by Intrauterine route once.   Yes [provider]  metroNIDAZOLE (METROCREAM)  0.75 % cream Apply 1 application topically 3 (three) times a week.   Yes [provider]  rizatriptan (MAXALT) 10 MG tablet TAKE 1 TABLET FOR MIGRAINE. MAY REPEAT IN 2 HOURS. NO MORE THAN 2/24 HOURS OR 2/3 DAYS 06/10/18  Yes York SpanielWillis, Charles K, MD    ROS:  Out of a complete 14 system review of symptoms, the patient complains only of the following symptoms, and all other reviewed systems are negative.  Frequent waking  Blood pressure 126/84, pulse 90, height 5\' 8"  (1.727 m), weight 295 lb (133.8 kg), SpO2 95 %.  Physical Exam  General: The patient is alert and cooperative at the time of the examination.  The patient is morbidly  obese.  Skin: No significant peripheral edema is noted.   Neurologic Exam  Mental status: The patient is alert and oriented x 3 at the time of the examination. The patient has apparent normal recent and remote memory, with an apparently normal attention span and concentration ability.   Cranial nerves: Facial symmetry is present. Speech is normal, no aphasia or dysarthria is noted. Extraocular movements are full. Visual fields are full.  Motor: The patient has good strength in all 4 extremities.  Sensory examination: Soft touch sensation is symmetric on the face, arms, and legs.  Coordination: The patient has good finger-nose-finger and heel-to-shin bilaterally.  Gait and station: The patient has a normal gait. Tandem gait is slightly unsteady. Romberg is negative. No drift is seen.  Reflexes: Deep tendon reflexes are symmetric.   Assessment/Plan:  1.  Morbid obesity  2.  History of seizures, well controlled  3.  History of migraine headache  The patient indicates that she is going to look for another job as her job appears to be causing stress that increases her headache.  The left cerebellar AVM has been obliterated with surgical resection.  The patient will continue Maxalt if needed, she will have blood work done today to check the Lamictal level.  She will follow-up here in about 6 months.  Marlan Palau. Keith Willis MD 07/12/2018 4:34 PM  [facility] [address] [place], [place] [id number]  Phone [phone] Fax [phone]

## 2018-07-14 ENCOUNTER — Telehealth: Payer: Self-pay

## 2018-07-14 LAB — COMPREHENSIVE METABOLIC PANEL
ALT: 17 IU/L (ref 0–32)
AST: 16 IU/L (ref 0–40)
Albumin/Globulin Ratio: 1.8 (ref 1.2–2.2)
Albumin: 4.6 g/dL (ref 3.8–4.8)
Alkaline Phosphatase: 72 IU/L (ref 39–117)
BUN/Creatinine Ratio: 13 (ref 9–23)
BUN: 13 mg/dL (ref 6–24)
Bilirubin Total: 0.3 mg/dL (ref 0.0–1.2)
CALCIUM: 10.1 mg/dL (ref 8.7–10.2)
CO2: 26 mmol/L (ref 20–29)
CREATININE: 0.99 mg/dL (ref 0.57–1.00)
Chloride: 100 mmol/L (ref 96–106)
GFR calc Af Amer: 81 mL/min/{1.73_m2} (ref >59)
GFR, EST NON AFRICAN AMERICAN: 71 mL/min/{1.73_m2} (ref >59)
GLOBULIN, TOTAL: 2.6 g/dL (ref 1.5–4.5)
Glucose: 86 mg/dL (ref 65–99)
Potassium: 4.6 mmol/L (ref 3.5–5.2)
SODIUM: 139 mmol/L (ref 134–144)
Total Protein: 7.2 g/dL (ref 6.0–8.5)

## 2018-07-14 LAB — LAMOTRIGINE LEVEL: Lamotrigine Lvl: 2.6 ug/mL (ref 2.0–20.0)

## 2018-07-14 NOTE — Telephone Encounter (Signed)
-----   Message from York Spaniel, MD sent at 07/14/2018  7:17 AM EST ----- Blood work is unremarkable, lamotrigine level is in the therapeutic range, no changes to dosing recommended.  Please call the patient. ----- Message ----- From: Nell Range Lab Results In Sent: 07/13/2018   7:39 AM EST To: York Spaniel, MD

## 2018-07-14 NOTE — Telephone Encounter (Signed)
I contacted the pt and advised of lab results per Dr. Stann Mainland via voicemail (ok per dpr). Pt advised to call back if she had further questions/concerns.

## 2018-07-27 ENCOUNTER — Other Ambulatory Visit: Payer: Self-pay | Admitting: Nurse Practitioner
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
## 2019-01-04 ENCOUNTER — Telehealth: Payer: Self-pay | Admitting: Neurology

## 2019-01-04 NOTE — Telephone Encounter (Signed)
I called patient and LVM regarding 7/27 appt with NP Sarah. Advised that appt will need to be a virtual visit, or rescheduled for in office if pt prefers due to schedule changes. Requested patient call back to discuss.

## 2019-01-10 ENCOUNTER — Ambulatory Visit: Payer: 59 | Admitting: Neurology

## 2019-01-10 ENCOUNTER — Encounter: Payer: Self-pay | Admitting: Neurology

## 2019-02-15 ENCOUNTER — Other Ambulatory Visit: Payer: Self-pay

## 2019-02-15 MED ORDER — LAMOTRIGINE 100 MG PO TABS: ORAL_TABLET | ORAL | 1 refills | Status: DC

## 2019-02-16 ENCOUNTER — Telehealth: Payer: Self-pay | Admitting: Neurology

## 2019-02-16 MED ORDER — LAMOTRIGINE 100 MG PO TABS: ORAL_TABLET | ORAL | 1 refills | Status: AC

## 2019-02-16 NOTE — Addendum Note (Signed)
Addended by: Verlin Grills T on: 02/16/2019 01:01 PM   Modules accepted: Orders

## 2019-02-16 NOTE — Telephone Encounter (Signed)
Rx was sent on 02/15/2019, confirmed by [facility]. I have resubmitted today.

## 2019-02-16 NOTE — Telephone Encounter (Signed)
Patient needs a refill of lamoTRIgine (LAMICTAL) 100 MG tablet
